# Patient Record
Sex: Male | Born: 1975 | Race: Black or African American | Hispanic: No | Marital: Single | Smoking: Current every day smoker
Health system: Southern US, Community
[De-identification: ages and names within clinical notes are randomized; demographics above are authoritative.]

---

## 2005-12-19 ENCOUNTER — Ambulatory Visit (HOSPITAL_BASED_OUTPATIENT_CLINIC_OR_DEPARTMENT_OTHER): Admission: RE | Admit: 2005-12-19 | Discharge: 2005-12-19 | Payer: Self-pay | Admitting: Orthopedic Surgery

## 2009-07-25 ENCOUNTER — Emergency Department (HOSPITAL_COMMUNITY): Admission: EM | Admit: 2009-07-25 | Discharge: 2009-07-25 | Payer: Self-pay | Admitting: Family Medicine

## 2010-05-30 ENCOUNTER — Emergency Department (HOSPITAL_COMMUNITY): Admission: EM | Admit: 2010-05-30 | Discharge: 2010-05-30 | Payer: Self-pay | Admitting: Family Medicine

## 2010-10-12 ENCOUNTER — Emergency Department (HOSPITAL_COMMUNITY)
Admission: EM | Admit: 2010-10-12 | Discharge: 2010-10-12 | Payer: Self-pay | Source: Home / Self Care | Admitting: Emergency Medicine

## 2010-10-17 LAB — CULTURE, ROUTINE-ABSCESS

## 2010-12-26 ENCOUNTER — Inpatient Hospital Stay (INDEPENDENT_AMBULATORY_CARE_PROVIDER_SITE_OTHER)
Admission: RE | Admit: 2010-12-26 | Discharge: 2010-12-26 | Disposition: A | Discharge status: Home / Self Care | Payer: Self-pay | Source: Ambulatory Visit | Attending: Family Medicine | Admitting: Family Medicine

## 2010-12-26 DIAGNOSIS — M7710 Lateral epicondylitis, unspecified elbow: Secondary | ICD-10-CM

## 2010-12-26 DIAGNOSIS — M67919 Unspecified disorder of synovium and tendon, unspecified shoulder: Secondary | ICD-10-CM

## 2010-12-26 LAB — POCT I-STAT, CHEM 8
BUN: 9 mg/dL (ref 6–23)
Chloride: 106 mEq/L (ref 96–112)
Creatinine, Ser: 0.9 mg/dL (ref 0.4–1.5)
Glucose, Bld: 88 mg/dL (ref 70–99)
Hemoglobin: 18.4 g/dL — ABNORMAL HIGH (ref 13.0–17.0)
Potassium: 4.1 mEq/L (ref 3.5–5.1)
Sodium: 138 mEq/L (ref 135–145)

## 2011-01-21 ENCOUNTER — Ambulatory Visit (HOSPITAL_COMMUNITY)
Admission: RE | Admit: 2011-01-21 | Discharge: 2011-01-21 | Disposition: A | Discharge status: Home / Self Care | Payer: Self-pay | Source: Ambulatory Visit | Attending: Family Medicine | Admitting: Family Medicine

## 2011-01-21 ENCOUNTER — Other Ambulatory Visit (HOSPITAL_COMMUNITY): Payer: Self-pay | Admitting: Family Medicine

## 2011-01-21 DIAGNOSIS — R52 Pain, unspecified: Secondary | ICD-10-CM

## 2011-01-21 DIAGNOSIS — M25819 Other specified joint disorders, unspecified shoulder: Secondary | ICD-10-CM | POA: Insufficient documentation

## 2011-01-21 DIAGNOSIS — M25519 Pain in unspecified shoulder: Secondary | ICD-10-CM | POA: Insufficient documentation

## 2011-02-08 NOTE — Op Note (Signed)
NAMEALIK, Blake Flores              ACCOUNT NO.:  [id number]   MEDICAL RECORD NO.:  [id number]          PATIENT TYPE:  AMB   LOCATION:  DSC                          FACILITY:  [facility]   PHYSICIAN:  Feliberto Gottron. Turner Daniels, M.D.   DATE OF BIRTH:  08-09-1976   DATE OF PROCEDURE:  12/19/2005  DATE OF DISCHARGE:  12/19/2005                                 OPERATIVE REPORT   PREOPERATIVE DIAGNOSIS:  Right distal radius fracture, closed, with 30  degrees of dorsal angulation and 1 cm of shortening.   POSTOPERATIVE DIAGNOSIS:  Right distal radius fracture, closed, with 30  degrees of dorsal angulation and 1 cm of shortening.   PROCEDURE:  Right distal radius fracture, open reduction/internal fixation  using a standard [employer] hand innovation DVR plate with all holes filled  proximally and distally.   SURGEON:  Feliberto Gottron. Turner Daniels, M.D.   FIRST ASSISTANT:  None.   ANESTHESIA:  General endotracheal.   ESTIMATED BLOOD LOSS:  Minimal.   FLUIDS REPLACED:  Crystalloid 500 cc.   TOURNIQUET TIME:  Forty minutes.   INDICATIONS FOR PROCEDURE:  A 35 year old right-hand dominant [place] who sustained a closed dorsally comminuted, 30 degree  apex volar angulated and 1 cm shortened right distal radius fracture trying  to elude the police just prior to his incarceration for grand theft auto.  In any event, he was brought to the [facility] orthopedic center clinic, I  believe the next day, where radiographs were reviewed, and he was felt to be  a candidate for open reduction/internal fixation with a DVR plate to prevent  deformity, increase function, and decrease pain.  Risks and benefits of  surgery were discussed with the patient, and he was prepared for surgical  intervention.   DESCRIPTION OF PROCEDURE:  The patient was identified by arm band, taken to  the operating room at [facility] day surgery center.  The appropriate anesthetic  monitors were attached.  General endotracheal anesthesia  induced with the  patient in the supine position.  The tourniquet was applied to the right  forearm.  The right upper extremity prepped and draped in the usual sterile  fashion from the fingertips to the forearm, and we began the procedure by  making a volar longitudinal incision starting out just distal to the wrist  flexion crease and then following the FCR tendon proximally for a distance  of about 8 cm through the skin and subcutaneous tissue.  Small bleeders were  identified and cauterized.  The sheath over the SCR tendon was then opened  volarly.  The FCR retracted radially and we then cut through the dorsal  sheath of the FCR going right down on top of the pronator quadratus, which  was actually partially torn from the fracture.  The pronator quadratus  insertion was lifted off of the radius with a small key elevator, exposing  the fracture site.  Traction was applied, and the fracture was reduced,  getting the length and angulation back to near anatomic.  The [facility] C-arm  imaging device was used to accomplish this goal.  Satisfied with  the  reduction, we then provisionally placed a [employer] right  standard DVR plate, fixing it to the radius with the glide hole volarly and  then positioning the plate under C-arm control to provide maximum fixation  from the distal locking screws.  A combination of smooth pins and distal  locking screws were then used to fix the fracture in a near-anatomic  position, as documented by the [facility] C-arm imaging device.  The other two  proximal screws were then filled with 3.5 bicortical screws, and final  images were taken, confirming good position of the DVR plate, the distal  locking screws, and the proximal bicortical screws.  At this point, the  tourniquet was let down.  Small bleeders were identified and cauterized.  We  took the wrist through a full range of motion to make sure that all fixation  was firm and stable.  We attempted to  repair the pronator quadratus back to  the radius, but it was simply too shredded, and it was simply laying on top  of the bone.  The subcutaneous tissue was then closed with a 3-0 Vicryl  suture, and the skin with a running, interlocking 4-0 nylon suture.  A  dressing of Xeroform, 4x4 dressing sponges, Webril and Ace wrap and a  Gelfoam splint was then applied.  The patient was then awakened and taken to  the recovery room without difficulty.      Feliberto Gottron. Turner Daniels, M.D.  Electronically Signed     FJR/MEDQ  D:  01/15/2006  T:  01/16/2006  Job:  [id number]

## 2017-10-19 ENCOUNTER — Encounter (HOSPITAL_COMMUNITY): Payer: Self-pay

## 2017-10-19 ENCOUNTER — Other Ambulatory Visit: Payer: Self-pay

## 2017-10-19 ENCOUNTER — Emergency Department (HOSPITAL_COMMUNITY)
Admission: EM | Admit: 2017-10-19 | Discharge: 2017-10-19 | Disposition: A | Discharge status: Home / Self Care | Payer: Self-pay | Attending: Emergency Medicine | Admitting: Emergency Medicine

## 2017-10-19 DIAGNOSIS — W57XXXA Bitten or stung by nonvenomous insect and other nonvenomous arthropods, initial encounter: Secondary | ICD-10-CM | POA: Insufficient documentation

## 2017-10-19 DIAGNOSIS — Y999 Unspecified external cause status: Secondary | ICD-10-CM | POA: Insufficient documentation

## 2017-10-19 DIAGNOSIS — Y939 Activity, unspecified: Secondary | ICD-10-CM | POA: Insufficient documentation

## 2017-10-19 DIAGNOSIS — S40861A Insect bite (nonvenomous) of right upper arm, initial encounter: Secondary | ICD-10-CM | POA: Insufficient documentation

## 2017-10-19 DIAGNOSIS — S40862A Insect bite (nonvenomous) of left upper arm, initial encounter: Secondary | ICD-10-CM | POA: Insufficient documentation

## 2017-10-19 DIAGNOSIS — Y929 Unspecified place or not applicable: Secondary | ICD-10-CM | POA: Insufficient documentation

## 2017-10-19 MED ORDER — HYDROXYZINE HCL 25 MG PO TABS: 25.0000 mg | ORAL_TABLET | Freq: Four times a day (QID) | ORAL | 0 refills | Status: DC

## 2017-10-19 NOTE — ED Notes (Signed)
Bed: WLPT3 Expected date:  Expected time:  Means of arrival:  Comments: 

## 2017-10-19 NOTE — ED Triage Notes (Signed)
He has numerous red, weeping lesions, mainly on right arm, which he tells me is from "bedbugs-I've had them before". He is otherwise healthy-looking.

## 2017-10-19 NOTE — ED Provider Notes (Signed)
  [facility]-EMERGENCY DEPT Provider Note   CSN: [id number] Arrival date & time: 10/19/17  0818     History   Chief Complaint Chief Complaint  Patient presents with  . Insect Bite    HPI Blake Flores is a 42 y.o. male.  42 year old male presents with rash to his body secondary to bug bites.  Patient has no bedbugs and has had some pruritus associated with this as well.  Denies any fever or chills.  Has been using calamine lotion as well as alcohol.  Has recently sprayed his home for the insects.  Nothing makes his symptoms better.      History reviewed. No pertinent past medical history.  There are no active problems to display for this patient.       Home Medications    Prior to Admission medications   Not on File    Family History No family history on file.  Social History Social History   Tobacco Use  . Smoking status: Never Smoker  . Smokeless tobacco: Never Used  Substance Use Topics  . Alcohol use: Not on file  . Drug use: Not on file     Allergies   Patient has no allergy information on record.   Review of Systems Review of Systems  All other systems reviewed and are negative.    Physical Exam Updated Vital Signs BP 121/78 (BP Location: Right Arm)   Pulse 90   Temp 97.8 F (36.6 C) (Oral)   Resp 16   SpO2 97%   Physical Exam  Constitutional: He is oriented to person, place, and time. He appears well-developed and well-nourished.  Non-toxic appearance.  HENT:  Head: Normocephalic and atraumatic.  Eyes: Conjunctivae are normal. Pupils are equal, round, and reactive to light.  Neck: Normal range of motion.  Cardiovascular: Normal rate.  Pulmonary/Chest: Effort normal.  Neurological: He is alert and oriented to person, place, and time.  Skin: Skin is warm and dry.  Multiple areas of small circular lesions noted to upper extremities without surrounding infections.  Psychiatric: He has a normal mood and affect.   Nursing note and vitals reviewed.    ED Treatments / Results  Labs (all labs ordered are listed, but only abnormal results are displayed) Labs Reviewed - No data to display  EKG  EKG Interpretation None       Radiology No results found.  Procedures Procedures (including critical care time)  Medications Ordered in ED Medications - No data to display   Initial Impression / Assessment and Plan / ED Course  I have reviewed the triage vital signs and the nursing notes.  Pertinent labs & imaging results that were available during my care of the patient were reviewed by me and considered in my medical decision making (see chart for details).     Patient has no signs of secondary infection.  Will prescribe Atarax.  Final Clinical Impressions(s) / ED Diagnoses   Final diagnoses:  None    ED Discharge Orders    None       Lorre NickAllen, Darriel Sinquefield, MD 10/19/17 [phone]

## 2017-10-27 ENCOUNTER — Inpatient Hospital Stay (HOSPITAL_COMMUNITY)
Admission: EM | Admit: 2017-10-27 | Discharge: 2017-11-01 | DRG: 603 | Disposition: A | Discharge status: Home / Self Care | Payer: Self-pay | Attending: Internal Medicine | Admitting: Internal Medicine

## 2017-10-27 ENCOUNTER — Encounter (HOSPITAL_COMMUNITY): Payer: Self-pay | Admitting: *Deleted

## 2017-10-27 ENCOUNTER — Other Ambulatory Visit: Payer: Self-pay

## 2017-10-27 DIAGNOSIS — S40862A Insect bite (nonvenomous) of left upper arm, initial encounter: Secondary | ICD-10-CM | POA: Diagnosis present

## 2017-10-27 DIAGNOSIS — R001 Bradycardia, unspecified: Secondary | ICD-10-CM | POA: Diagnosis present

## 2017-10-27 DIAGNOSIS — L03114 Cellulitis of left upper limb: Principal | ICD-10-CM | POA: Diagnosis present

## 2017-10-27 DIAGNOSIS — Z23 Encounter for immunization: Secondary | ICD-10-CM

## 2017-10-27 DIAGNOSIS — L299 Pruritus, unspecified: Secondary | ICD-10-CM | POA: Diagnosis present

## 2017-10-27 DIAGNOSIS — L739 Follicular disorder, unspecified: Secondary | ICD-10-CM

## 2017-10-27 DIAGNOSIS — L0390 Cellulitis, unspecified: Secondary | ICD-10-CM

## 2017-10-27 LAB — CBC WITH DIFFERENTIAL/PLATELET
BASOS ABS: 0 10*3/uL (ref 0.0–0.1)
BASOS PCT: 0 %
EOS ABS: 0.5 10*3/uL (ref 0.0–0.7)
Eosinophils Relative: 4 %
HEMATOCRIT: 43.8 % (ref 39.0–52.0)
HEMOGLOBIN: 15.2 g/dL (ref 13.0–17.0)
LYMPHS PCT: 46 %
Lymphs Abs: 5.2 10*3/uL — ABNORMAL HIGH (ref 0.7–4.0)
MCH: 28.4 pg (ref 26.0–34.0)
MCHC: 34.7 g/dL (ref 30.0–36.0)
MCV: 81.9 fL (ref 78.0–100.0)
MONOS PCT: 10 %
Monocytes Absolute: 1.1 10*3/uL — ABNORMAL HIGH (ref 0.1–1.0)
NEUTROS ABS: 4.5 10*3/uL (ref 1.7–7.7)
NEUTROS PCT: 40 %
Platelets: 315 10*3/uL (ref 150–400)
RBC: 5.35 MIL/uL (ref 4.22–5.81)
RDW: 15.9 % — ABNORMAL HIGH (ref 11.5–15.5)
WBC: 11.3 10*3/uL — ABNORMAL HIGH (ref 4.0–10.5)

## 2017-10-27 LAB — CBG MONITORING, ED: GLUCOSE-CAPILLARY: 106 mg/dL — AB (ref 65–99)

## 2017-10-27 LAB — BASIC METABOLIC PANEL
ANION GAP: 9 (ref 5–15)
BUN: 14 mg/dL (ref 6–20)
CALCIUM: 8.7 mg/dL — AB (ref 8.9–10.3)
CO2: 26 mmol/L (ref 22–32)
Chloride: 104 mmol/L (ref 101–111)
Creatinine, Ser: 0.88 mg/dL (ref 0.61–1.24)
GLUCOSE: 77 mg/dL (ref 65–99)
POTASSIUM: 4.2 mmol/L (ref 3.5–5.1)
Sodium: 139 mmol/L (ref 135–145)

## 2017-10-27 MED ORDER — CLINDAMYCIN PHOSPHATE 300 MG/50ML IV SOLN
300.0000 mg | Freq: Three times a day (TID) | INTRAVENOUS | Status: DC
  Administered 2017-10-28: 300 mg via INTRAVENOUS
  Filled 2017-10-27 (×2): qty 50

## 2017-10-27 MED ORDER — ONDANSETRON HCL 4 MG/2ML IJ SOLN: 4.0000 mg | Freq: Four times a day (QID) | INTRAMUSCULAR | Status: DC | PRN

## 2017-10-27 MED ORDER — ACETAMINOPHEN 325 MG PO TABS: 650.0000 mg | ORAL_TABLET | Freq: Four times a day (QID) | ORAL | Status: DC | PRN

## 2017-10-27 MED ORDER — HYDROXYZINE HCL 25 MG PO TABS
25.0000 mg | ORAL_TABLET | Freq: Three times a day (TID) | ORAL | Status: DC | PRN
  Administered 2017-10-28 – 2017-10-30 (×4): 25 mg via ORAL
  Filled 2017-10-27 (×4): qty 1

## 2017-10-27 MED ORDER — CLINDAMYCIN PHOSPHATE 300 MG/50ML IV SOLN
300.0000 mg | Freq: Once | INTRAVENOUS | Status: AC
Start: 2017-10-27 — End: 2017-10-27
  Administered 2017-10-27: 300 mg via INTRAVENOUS
  Filled 2017-10-27 (×2): qty 50

## 2017-10-27 MED ORDER — PERMETHRIN 5 % EX CREA
TOPICAL_CREAM | Freq: Once | CUTANEOUS | Status: AC
  Administered 2017-10-27: via TOPICAL
  Filled 2017-10-27: qty 60

## 2017-10-27 MED ORDER — SODIUM CHLORIDE 0.9 % IV SOLN
INTRAVENOUS | Status: DC
  Administered 2017-10-27: 23:00:00 via INTRAVENOUS

## 2017-10-27 MED ORDER — SENNOSIDES-DOCUSATE SODIUM 8.6-50 MG PO TABS: 1.0000 | ORAL_TABLET | Freq: Every evening | ORAL | Status: DC | PRN

## 2017-10-27 MED ORDER — ACETAMINOPHEN 650 MG RE SUPP: 650.0000 mg | Freq: Four times a day (QID) | RECTAL | Status: DC | PRN

## 2017-10-27 MED ORDER — ONDANSETRON HCL 4 MG PO TABS: 4.0000 mg | ORAL_TABLET | Freq: Four times a day (QID) | ORAL | Status: DC | PRN

## 2017-10-27 MED ORDER — INFLUENZA VAC SPLIT QUAD 0.5 ML IM SUSY
0.5000 mL | PREFILLED_SYRINGE | INTRAMUSCULAR | Status: AC
  Administered 2017-10-28: 0.5 mL via INTRAMUSCULAR
  Filled 2017-10-27: qty 0.5

## 2017-10-27 MED ORDER — INSULIN ASPART 100 UNIT/ML ~~LOC~~ SOLN: 0.0000 [IU] | Freq: Every day | SUBCUTANEOUS | Status: DC

## 2017-10-27 MED ORDER — INSULIN ASPART 100 UNIT/ML ~~LOC~~ SOLN
0.0000 [IU] | Freq: Three times a day (TID) | SUBCUTANEOUS | Status: DC
  Administered 2017-10-28 – 2017-11-01 (×2): 1 [IU] via SUBCUTANEOUS

## 2017-10-27 NOTE — ED Provider Notes (Signed)
[facility] EMERGENCY DEPARTMENT Provider Note   CSN: [id number] Arrival date & time: 10/27/17  4098     History   Chief Complaint Chief Complaint  Patient presents with  . Insect Bite  . Cellulitis    HPI Blake Flores is a 42 y.o. male.   Rash   This is a new problem. The current episode started more than 1 week ago. The problem has been rapidly worsening. The problem is associated with an insect bite/sting (known bedbugs, treated home). There has been no fever. The rash is present on the left arm. The pain is moderate. The pain has been constant since onset. Associated symptoms include itching, pain and weeping. Treatments tried: lotion, alcohol spray, hydroxyzine. The treatment provided no relief.    History reviewed. No pertinent past medical history.  There are no active problems to display for this patient.   History reviewed. No pertinent surgical history.     Home Medications    Prior to Admission medications   Medication Sig Start Date End Date Taking? Authorizing Provider  hydrOXYzine (ATARAX/VISTARIL) 25 MG tablet Take 1 tablet (25 mg total) by mouth every 6 (six) hours. 10/19/17   Lorre Nick, MD    Family History History reviewed. No pertinent family history.  Social History Social History   Tobacco Use  . Smoking status: Never Smoker  . Smokeless tobacco: Never Used  Substance Use Topics  . Alcohol use: Not on file  . Drug use: Not on file     Allergies   Patient has no known allergies.   Review of Systems Review of Systems  Constitutional: Negative for chills and fever.  HENT: Negative for ear pain and sore throat.   Eyes: Negative for pain and visual disturbance.  Respiratory: Negative for cough and shortness of breath.   Cardiovascular: Negative for chest pain and palpitations.  Gastrointestinal: Negative for abdominal pain and vomiting.  Genitourinary: Negative for dysuria and hematuria.  Musculoskeletal:  Negative for arthralgias and back pain.  Skin: Positive for itching and rash. Negative for color change.  Neurological: Negative for seizures and syncope.  All other systems reviewed and are negative.    Physical Exam Updated Vital Signs BP 128/79   Pulse (!) 48   Temp 97.7 F (36.5 C) (Oral)   Resp 18   SpO2 95%   Physical Exam  Constitutional: He is oriented to person, place, and time. He appears well-developed and well-nourished.  HENT:  Head: Normocephalic and atraumatic.  Eyes: Conjunctivae and EOM are normal. Pupils are equal, round, and reactive to light.  Neck: Neck supple.  Cardiovascular: Normal rate and regular rhythm.  Pulmonary/Chest: Effort normal and breath sounds normal. No respiratory distress.  Abdominal: Soft. There is no tenderness.  Musculoskeletal: He exhibits no edema.  Neurological: He is alert and oriented to person, place, and time.  Skin: Skin is warm and dry. Rash (foliculitis with desquamation) noted.  Psychiatric: He has a normal mood and affect.  Nursing note and vitals reviewed.    ED Treatments / Results  Labs (all labs ordered are listed, but only abnormal results are displayed) Labs Reviewed  BASIC METABOLIC PANEL - Abnormal; Notable for the following components:      Result Value   Calcium 8.7 (*)    All other components within normal limits  CBC WITH DIFFERENTIAL/PLATELET - Abnormal; Notable for the following components:   WBC 11.3 (*)    RDW 15.9 (*)    Lymphs Abs 5.2 (*)  Monocytes Absolute 1.1 (*)    All other components within normal limits    EKG  EKG Interpretation None       Radiology No results found.  Procedures Procedures (including critical care time)  Medications Ordered in ED Medications  clindamycin (CLEOCIN) IVPB 300 mg (not administered)     Initial Impression / Assessment and Plan / ED Course  I have reviewed the triage vital signs and the nursing notes.  Pertinent labs & imaging results  that were available during my care of the patient were reviewed by me and considered in my medical decision making (see chart for details).     Blake Flores is a 42 year old male with noCharmayne Sheer significant past medical history who presents for evaluation of left arm rash.  Patient was seen in the emergency department 1 week ago and was diagnosed with bedbug bites.  He was provided antipruritic medications, no antibiotics.  The patient states that he has since developed a painful, burning, pustular rash with areas of desquamation.  Labs obtained are significant for leukocytosis.  Patient is provided clindamycin in the emergency department.  Patient is admitted for observation to the hospitalist.  Final Clinical Impressions(s) / ED Diagnoses   Final diagnoses:  Folliculitis  Cellulitis, unspecified cellulitis site    ED Discharge Orders    None       Garey Hamean, Sunil Hue E, MD 10/28/17 [id number]    Gerhard MunchLockwood, Robert, MD 10/29/17 [phone]

## 2017-10-27 NOTE — H&P (Signed)
History and Physical    Blake Flores XLK:[id number] DOB: 01/07/1976 DOA: 10/27/2017  PCP: Patient, No Pcp Per Patient coming from: Home  Chief Complaint: Itching and left arm pain  HPI: Blake Flores is a 42 y.o. male without any significant past medical history came to the hospital with complains of left arm pain and diffuse itching.  Patient states these complaints started about 2 weeks ago and had presented to the ER about a week ago.  At that time there were concerns of bedbugs at home and was discharged from the ER with Atarax.  After going home patient continued to have this and started noticing of left upper extremity warmth, swelling and multiple "pimples".  In the meantime he admits that he did have get his home treated for bedbugs.  In the ER patient was noted to have quite significant swelling, erythema and warmth of the left upper extremity along with diffuse folliculitis.  She was started on IV clindamycin and admitted for observation.  Denies any PMHX and no home medications.  Patient doesn't know any family history.    Review of Systems: As per HPI otherwise 10 point review of systems negative.   History reviewed. No pertinent past medical history.  History reviewed. No pertinent surgical history.   reports that  has never smoked. he has never used smokeless tobacco. His alcohol and drug histories are not on file.  No Known Allergies  History reviewed. No pertinent family history.   Prior to Admission medications   Not on File    Physical Exam: Vitals:   10/27/17 2045 10/27/17 2100 10/27/17 2115 10/27/17 2130  BP: 128/71 128/75  118/77  Pulse: (!) 58  74 (!) 58  Resp: 16   16  Temp:      TempSrc:      SpO2: 97%  97% 97%      Constitutional: NAD, calm, comfortable Vitals:   10/27/17 2045 10/27/17 2100 10/27/17 2115 10/27/17 2130  BP: 128/71 128/75  118/77  Pulse: (!) 58  74 (!) 58  Resp: 16   16  Temp:      TempSrc:      SpO2: 97%  97% 97%    Eyes: PERRL, lids and conjunctivae normal ENMT: Mucous membranes are moist. Posterior pharynx clear of any exudate or lesions.Normal dentition.  Neck: normal, supple, no masses, no thyromegaly Respiratory: clear to auscultation bilaterally, no wheezing, no crackles. Normal respiratory effort. No accessory muscle use.  Cardiovascular: Regular rate and rhythm, no murmurs / rubs / gallops. No extremity edema. 2+ pedal pulses. No carotid bruits.  Abdomen: no tenderness, no masses palpated. No hepatosplenomegaly. Bowel sounds positive.  Musculoskeletal: no clubbing / cyanosis. No joint deformity upper and lower extremities. Good ROM, no contractures. Normal muscle tone.  Skin: Left upper extremity warmth and slight swelling noted with diffuse folliculitis.  Multiple skin abrasion noted throughout from constant itching. Neurologic: CN 2-12 grossly intact. Sensation intact, DTR normal. Strength 5/5 in all 4.  Psychiatric: Normal judgment and insight. Alert and oriented x 3. Normal mood.     Labs on Admission: I have personally reviewed following labs and imaging studies  CBC: Recent Labs  Lab 10/27/17 1330  WBC 11.3*  NEUTROABS 4.5  HGB 15.2  HCT 43.8  MCV 81.9  PLT 315   Basic Metabolic Panel: Recent Labs  Lab 10/27/17 1330  NA 139  K 4.2  CL 104  CO2 26  GLUCOSE 77  BUN 14  CREATININE 0.88  CALCIUM 8.7*  GFR: CrCl cannot be calculated (Unknown ideal weight.). Liver Function Tests: No results for input(s): AST, ALT, ALKPHOS, BILITOT, PROT, ALBUMIN in the last 168 hours. No results for input(s): LIPASE, AMYLASE in the last 168 hours. No results for input(s): AMMONIA in the last 168 hours. Coagulation Profile: No results for input(s): INR, PROTIME in the last 168 hours. Cardiac Enzymes: No results for input(s): CKTOTAL, CKMB, CKMBINDEX, TROPONINI in the last 168 hours. BNP (last 3 results) No results for input(s): PROBNP in the last 8760 hours. HbA1C: No results for  input(s): HGBA1C in the last 72 hours. CBG: No results for input(s): GLUCAP in the last 168 hours. Lipid Profile: No results for input(s): CHOL, HDL, LDLCALC, TRIG, CHOLHDL, LDLDIRECT in the last 72 hours. Thyroid Function Tests: No results for input(s): TSH, T4TOTAL, FREET4, T3FREE, THYROIDAB in the last 72 hours. Anemia Panel: No results for input(s): VITAMINB12, FOLATE, FERRITIN, TIBC, IRON, RETICCTPCT in the last 72 hours. Urine analysis: No results found for: COLORURINE, APPEARANCEUR, LABSPEC, PHURINE, GLUCOSEU, HGBUR, BILIRUBINUR, KETONESUR, PROTEINUR, UROBILINOGEN, NITRITE, LEUKOCYTESUR Sepsis Labs: !!!!!!!!!!!!!!!!!!!!!!!!!!!!!!!!!!!!!!!!!!!! @LABRCNTIP (procalcitonin:4,lacticidven:4) )No results found for this or any previous visit (from the past 240 hour(s)).   Radiological Exams on Admission: No results found.  EKG: Independently reviewed.   Assessment/Plan Active Problems:   Cellulitis    Cellulitis of the left upper extremity with concerning folliculitis Multiple bedbugs bite/skin irritation - Admit for observation given the extent of the cellulitis -We will give IV clindamycin, IV fluids -Pain control, order permethrin cream 5% -Pain control, Atarax for itching -Provide supportive care    DVT prophylaxis: Early ambulation Code Status: Full code Family Communication: Patient communicates well Disposition Plan: To be determined Consults called: None Admission status: Observation   Ankit Joline Maxcy MD [facility] Pager [phone]  If 7PM-7AM, please contact night-coverage [email or web address] Password [email or web address]  10/27/2017, 10:13 PM

## 2017-10-27 NOTE — ED Notes (Signed)
Ordered pt a dinner tray 

## 2017-10-27 NOTE — ED Triage Notes (Signed)
[facility] and diagnosed with bed bugs. Now has redness, pain, swelling and drainage to entire left arm.

## 2017-10-27 NOTE — ED Notes (Signed)
Dinner tray at bedside

## 2017-10-27 NOTE — ED Notes (Signed)
ED Provider at bedside. 

## 2017-10-28 ENCOUNTER — Observation Stay (HOSPITAL_BASED_OUTPATIENT_CLINIC_OR_DEPARTMENT_OTHER): Payer: Self-pay

## 2017-10-28 DIAGNOSIS — M79609 Pain in unspecified limb: Secondary | ICD-10-CM

## 2017-10-28 DIAGNOSIS — M7989 Other specified soft tissue disorders: Secondary | ICD-10-CM

## 2017-10-28 LAB — GLUCOSE, CAPILLARY
GLUCOSE-CAPILLARY: 88 mg/dL (ref 65–99)
Glucose-Capillary: 119 mg/dL — ABNORMAL HIGH (ref 65–99)
Glucose-Capillary: 134 mg/dL — ABNORMAL HIGH (ref 65–99)
Glucose-Capillary: 100 mg/dL — ABNORMAL HIGH (ref 65–99)

## 2017-10-28 LAB — COMPREHENSIVE METABOLIC PANEL
ALK PHOS: 72 U/L (ref 38–126)
ALT: 18 U/L (ref 17–63)
ANION GAP: 11 (ref 5–15)
AST: 17 U/L (ref 15–41)
Albumin: 3 g/dL — ABNORMAL LOW (ref 3.5–5.0)
BILIRUBIN TOTAL: 0.7 mg/dL (ref 0.3–1.2)
BUN: 14 mg/dL (ref 6–20)
CALCIUM: 8.3 mg/dL — AB (ref 8.9–10.3)
CO2: 23 mmol/L (ref 22–32)
CREATININE: 0.89 mg/dL (ref 0.61–1.24)
Chloride: 106 mmol/L (ref 101–111)
Glucose, Bld: 98 mg/dL (ref 65–99)
Potassium: 3.8 mmol/L (ref 3.5–5.1)
SODIUM: 140 mmol/L (ref 135–145)
TOTAL PROTEIN: 5.5 g/dL — AB (ref 6.5–8.1)

## 2017-10-28 LAB — HIV ANTIBODY (ROUTINE TESTING W REFLEX): HIV SCREEN 4TH GENERATION: NONREACTIVE

## 2017-10-28 MED ORDER — CEFTRIAXONE SODIUM 2 G IJ SOLR
2.0000 g | INTRAMUSCULAR | Status: DC
Start: 2017-10-28 — End: 2017-10-31
  Administered 2017-10-28 – 2017-10-30 (×3): 2 g via INTRAVENOUS
  Filled 2017-10-28 (×5): qty 2

## 2017-10-28 MED ORDER — TRAMADOL HCL 50 MG PO TABS: 50.0000 mg | ORAL_TABLET | Freq: Four times a day (QID) | ORAL | Status: DC | PRN

## 2017-10-28 MED ORDER — OXYCODONE HCL 5 MG PO TABS
5.0000 mg | ORAL_TABLET | Freq: Four times a day (QID) | ORAL | Status: DC | PRN
  Administered 2017-10-28 – 2017-10-30 (×6): 5 mg via ORAL
  Filled 2017-10-28 (×6): qty 1

## 2017-10-28 MED ORDER — VANCOMYCIN HCL 10 G IV SOLR
1500.0000 mg | Freq: Two times a day (BID) | INTRAVENOUS | Status: DC
  Administered 2017-10-28 – 2017-10-31 (×6): 1500 mg via INTRAVENOUS
  Filled 2017-10-28 (×7): qty 1500

## 2017-10-28 MED ORDER — TRIAMCINOLONE ACETONIDE 0.1 % EX CREA
TOPICAL_CREAM | Freq: Two times a day (BID) | CUTANEOUS | Status: DC
  Administered 2017-10-28 – 2017-11-01 (×9): via TOPICAL
  Filled 2017-10-28 (×2): qty 15

## 2017-10-28 NOTE — Progress Notes (Signed)
Pharmacy Antibiotic Note  Blake Flores is a 42 y.o. male admitted on 10/27/2017 with cellulitis.  Pharmacy has been consulted for vancomycin dosing.  Patient with inset bites- house has been treated for bed bugs. Originally placed on clindamycin IV, now to transition to ceftriaxone and vancomycin.  Plan: Vnacomycin 1500mg  IV q12h Ceftriaxone 2g IV q24h per MD Follow clinical progression, de-escalation/LOT plans Follow renal function, vancomycin trough PRN (goal 10-7115mcg/mL)  Height: 5\' 10"  (177.8 cm) Weight: 158 lb 8.2 oz (71.9 kg) IBW/kg (Calculated) : 73  Temp (24hrs), Avg:97.9 F (36.6 C), Min:97.7 F (36.5 C), Max:98.2 F (36.8 C)  Recent Labs  Lab 10/27/17 1330 10/28/17 0356  WBC 11.3*  --   CREATININE 0.88 0.89    Estimated Creatinine Clearance: 108.8 mL/min (by C-G formula based on SCr of 0.89 mg/dL).    No Known Allergies  Antimicrobials this admission: Clinda 2/4>>2/5 Ceftriaxone 2/5>> Vanc 2/5>>  Dose adjustments this admission: n/a  Microbiology results: none  Thank you for allowing pharmacy to be a part of this patient's care.  Zayan Delvecchio D. Tammara Massing, PharmD, BCPS Clinical Pharmacist Clinical Phone for 10/28/2017 until 3:30pm: [phone] If after 3:30pm, please call main pharmacy at [phone] 10/28/2017 11:56 AM

## 2017-10-28 NOTE — Progress Notes (Signed)
Visited with patient and shared Advanced Directive forms with him. Shared to call for chaplain when ready to fill out the forms and get signatures. Phebe CollaDonna S Augusten Lipkin, Chaplain   10/28/17 1000  Clinical Encounter Type  Visited With Patient  Visit Type Initial;Other (Comment) (Gave AD forms)  Referral From Physician  Consult/Referral To Chaplain

## 2017-10-28 NOTE — Progress Notes (Signed)
Patient ID: Blake Flores, male   DOB: 07-Jul-1976, 42 y.o.   MRN: [id number]  PROGRESS NOTE    Blake Flores  WUJ:[id number] DOB: Aug 20, 1976 DOA: 10/27/2017 PCP: Patient, No Pcp Per   Brief Narrative:  42 year old male with no past medical history presented with worsening left arm pain and diffuse itching for the last 2 weeks.  He was admitted with left upper extremity cellulitis with concerns for multiple bedbugs bite and started on IV clindamycin.   Assessment & Plan:   Active Problems:   Cellulitis    Cellulitis of the left upper extremity with concerning folliculitis most likely due to multiple bedbugs bite/skin irritation -Currently on clindamycin.  Change antibiotics to vancomycin and Rocephin  -Continue pain management and antihistamines -Left upper extremity duplex to rule out DVT -If skin lesions do not improve with antibiotics, patient might need dermatology evaluation - apply triamcinolone cream - check HIV   DVT prophylaxis: Early ambulation Code Status: Full Family Communication: None at bedside Disposition Plan: Home in 1-2 days  Consultants: None  Procedures: None  Antimicrobials: Clindamycin from 10/27/2017 onwards   Subjective: Patient seen and examined at bedside.  He does not think that his left upper extremity is getting better.  No overnight fever or vomiting.  Objective: Vitals:   10/27/17 2336 10/27/17 2339 10/28/17 0500 10/28/17 0657  BP: 130/75   112/65  Pulse: 62   61  Resp:  18    Temp: 97.9 F (36.6 C)   97.9 F (36.6 C)  TempSrc: Oral Oral  Oral  SpO2: 99%   96%  Weight:  71.9 kg (158 lb 8.2 oz) 71.9 kg (158 lb 8.2 oz)   Height:  5\' 10"  (1.778 m)      Intake/Output Summary (Last 24 hours) at 10/28/2017 1129 Last data filed at 10/28/2017 0300 Gross per 24 hour  Intake 539.58 ml  Output -  Net 539.58 ml   Filed Weights   10/27/17 2339 10/28/17 0500  Weight: 71.9 kg (158 lb 8.2 oz) 71.9 kg (158 lb 8.2 oz)    Examination:  General  exam: Appears calm and comfortable  Respiratory system: Bilateral decreased breath sound at bases Cardiovascular system: S1 & S2 heard, rate controlled  gastrointestinal system: Abdomen is nondistended, soft and nontender. Normal bowel sounds heard. Skin: Left upper extremity warmth and slight swelling noted with diffuse folliculitis.  Multiple skin abrasion noted   Data Reviewed: I have personally reviewed following labs and imaging studies  CBC: Recent Labs  Lab 10/27/17 1330  WBC 11.3*  NEUTROABS 4.5  HGB 15.2  HCT 43.8  MCV 81.9  PLT 315   Basic Metabolic Panel: Recent Labs  Lab 10/27/17 1330 10/28/17 0356  NA 139 140  K 4.2 3.8  CL 104 106  CO2 26 23  GLUCOSE 77 98  BUN 14 14  CREATININE 0.88 0.89  CALCIUM 8.7* 8.3*   GFR: Estimated Creatinine Clearance: 108.8 mL/min (by C-G formula based on SCr of 0.89 mg/dL). Liver Function Tests: Recent Labs  Lab 10/28/17 0356  AST 17  ALT 18  ALKPHOS 72  BILITOT 0.7  PROT 5.5*  ALBUMIN 3.0*   No results for input(s): LIPASE, AMYLASE in the last 168 hours. No results for input(s): AMMONIA in the last 168 hours. Coagulation Profile: No results for input(s): INR, PROTIME in the last 168 hours. Cardiac Enzymes: No results for input(s): CKTOTAL, CKMB, CKMBINDEX, TROPONINI in the last 168 hours. BNP (last 3 results) No results for input(s): PROBNP in  the last 8760 hours. HbA1C: No results for input(s): HGBA1C in the last 72 hours. CBG: Recent Labs  Lab 10/27/17 2236 10/28/17 0834  GLUCAP 106* 88   Lipid Profile: No results for input(s): CHOL, HDL, LDLCALC, TRIG, CHOLHDL, LDLDIRECT in the last 72 hours. Thyroid Function Tests: No results for input(s): TSH, T4TOTAL, FREET4, T3FREE, THYROIDAB in the last 72 hours. Anemia Panel: No results for input(s): VITAMINB12, FOLATE, FERRITIN, TIBC, IRON, RETICCTPCT in the last 72 hours. Sepsis Labs: No results for input(s): PROCALCITON, LATICACIDVEN in the last 168  hours.  No results found for this or any previous visit (from the past 240 hour(s)).       Radiology Studies: No results found.      Scheduled Meds: . Influenza vac split quadrivalent PF  0.5 mL Intramuscular Tomorrow-1000  . insulin aspart  0-5 Units Subcutaneous QHS  . insulin aspart  0-9 Units Subcutaneous TID WC   Continuous Infusions: . clindamycin (CLEOCIN) IV Stopped (10/28/17 0316)     LOS: 0 days        Glade LloydKshitiz Nicie Milan, MD [facility] Pager [phone]  If 7PM-7AM, please contact night-coverage [email or web address] Password [email or web address] 10/28/2017, 11:29 AM

## 2017-10-28 NOTE — Progress Notes (Signed)
Preliminary results by tech - Venous Duplex Left Upper Ext. Completed. Negative for deep and superficial vein thrombosis. Blake Flores, BS, RDMS, RVT

## 2017-10-29 LAB — CBC WITH DIFFERENTIAL/PLATELET
BASOS ABS: 0 10*3/uL (ref 0.0–0.1)
Basophils Relative: 0 %
EOS ABS: 0.5 10*3/uL (ref 0.0–0.7)
Eosinophils Relative: 5 %
HCT: 41.8 % (ref 39.0–52.0)
Hemoglobin: 14 g/dL (ref 13.0–17.0)
Lymphocytes Relative: 39 %
Lymphs Abs: 4.2 10*3/uL — ABNORMAL HIGH (ref 0.7–4.0)
MCH: 27.6 pg (ref 26.0–34.0)
MCHC: 33.5 g/dL (ref 30.0–36.0)
MCV: 82.3 fL (ref 78.0–100.0)
MONO ABS: 1 10*3/uL (ref 0.1–1.0)
Monocytes Relative: 9 %
NEUTROS ABS: 5 10*3/uL (ref 1.7–7.7)
NEUTROS PCT: 47 %
PLATELETS: 294 10*3/uL (ref 150–400)
RBC: 5.08 MIL/uL (ref 4.22–5.81)
RDW: 16.1 % — AB (ref 11.5–15.5)
WBC: 10.7 10*3/uL — ABNORMAL HIGH (ref 4.0–10.5)

## 2017-10-29 LAB — COMPREHENSIVE METABOLIC PANEL
ALT: 16 U/L — ABNORMAL LOW (ref 17–63)
ANION GAP: 10 (ref 5–15)
AST: 19 U/L (ref 15–41)
Albumin: 3 g/dL — ABNORMAL LOW (ref 3.5–5.0)
Alkaline Phosphatase: 74 U/L (ref 38–126)
BUN: 9 mg/dL (ref 6–20)
CHLORIDE: 106 mmol/L (ref 101–111)
CO2: 23 mmol/L (ref 22–32)
CREATININE: 0.9 mg/dL (ref 0.61–1.24)
Calcium: 8.1 mg/dL — ABNORMAL LOW (ref 8.9–10.3)
Glucose, Bld: 94 mg/dL (ref 65–99)
POTASSIUM: 4.1 mmol/L (ref 3.5–5.1)
SODIUM: 139 mmol/L (ref 135–145)
Total Bilirubin: 0.4 mg/dL (ref 0.3–1.2)
Total Protein: 5.4 g/dL — ABNORMAL LOW (ref 6.5–8.1)

## 2017-10-29 LAB — HIV ANTIBODY (ROUTINE TESTING W REFLEX): HIV SCREEN 4TH GENERATION: NONREACTIVE

## 2017-10-29 LAB — MAGNESIUM: MAGNESIUM: 2 mg/dL (ref 1.7–2.4)

## 2017-10-29 LAB — GLUCOSE, CAPILLARY
GLUCOSE-CAPILLARY: 108 mg/dL — AB (ref 65–99)
GLUCOSE-CAPILLARY: 100 mg/dL — AB (ref 65–99)
Glucose-Capillary: 108 mg/dL — ABNORMAL HIGH (ref 65–99)

## 2017-10-29 LAB — C-REACTIVE PROTEIN: CRP: <0.8 mg/dL (ref <1.0)

## 2017-10-29 NOTE — Progress Notes (Signed)
Patient ID: Blake Flores, male   DOB: Feb 09, 1976, 42 y.o.   MRN: [id number]  PROGRESS NOTE    Blake Flores  WUJ:[id number] DOB: Feb 05, 1976 DOA: 10/27/2017 PCP: Patient, No Pcp Per   Brief Narrative:  42 year old male with no past medical history presented with worsening left arm pain and diffuse itching for the last 2 weeks.  He was admitted with left upper extremity cellulitis with concerns for multiple bedbugs bite and started on IV clindamycin.  Subjective:  10/29/17: patient seen and examined at his bedside. Reports persistent left arm pruritus and left abdominal pruritus. No other complaints.   Assessment & Plan:   Active Problems:   Cellulitis   Cellulitis of the left upper extremity with concerning folliculitis most likely due to multiple bedbugs bite/skin irritation -10/29/17 continue IV vancomycin and IV rocephin clindamycin -Continue pain management and antihistamines -Left upper extremity duplex negative for DVT -IF no improvement today will consult dermatology in the am -continue triamcinolone cream -10/29/17 HIV non reactive  Bradycardia, asymptomatic -HR 50 -continue to monitor on telemetry   DVT prophylaxis: Early ambulation/SCDs Code Status: Full Family Communication: None at bedside Disposition Plan: Home in 1-2 days  Consultants: None  Procedures: None  Antimicrobials: Clindamycin from 10/27/2017 on wards    Objective: Vitals:   10/28/17 0657 10/28/17 1456 10/28/17 2159 10/29/17 0500  BP: 112/65 138/75 137/83   Pulse: 61 (!) 55 (!) 50   Resp:  18 17   Temp: 97.9 F (36.6 C) 98.1 F (36.7 C) 97.6 F (36.4 C)   TempSrc: Oral Oral Oral   SpO2: 96% 100% 100%   Weight:    72.4 kg (159 lb 9.8 oz)  Height:        Intake/Output Summary (Last 24 hours) at 10/29/2017 0814 Last data filed at 10/29/2017 0054 Gross per 24 hour  Intake 1050 ml  Output -  Net 1050 ml   Filed Weights   10/27/17 2339 10/28/17 0500 10/29/17 0500  Weight: 71.9 kg (158 lb 8.2  oz) 71.9 kg (158 lb 8.2 oz) 72.4 kg (159 lb 9.8 oz)    Examination: 10/29/17 patient seen and examined. No change from prior exam.  General exam: Appears calm and comfortable  Respiratory system: Bilateral decreased breath sound at bases Cardiovascular system: S1 & S2 heard, rate controlled  gastrointestinal system: Abdomen is nondistended, soft and nontender. Normal bowel sounds heard. Skin: Left upper extremity warmth and slight swelling noted with diffuse folliculitis.  Multiple skin abrasion noted. Also left sided  Abdominal diffused folliculitis.   Data Reviewed: I have personally reviewed following labs and imaging studies  CBC: Recent Labs  Lab 10/27/17 1330 10/29/17 0415  WBC 11.3* 10.7*  NEUTROABS 4.5 5.0  HGB 15.2 14.0  HCT 43.8 41.8  MCV 81.9 82.3  PLT 315 294   Basic Metabolic Panel: Recent Labs  Lab 10/27/17 1330 10/28/17 0356 10/29/17 0415  NA 139 140 139  K 4.2 3.8 4.1  CL 104 106 106  CO2 26 23 23   GLUCOSE 77 98 94  BUN 14 14 9   CREATININE 0.88 0.89 0.90  CALCIUM 8.7* 8.3* 8.1*  MG  --   --  2.0   GFR: Estimated Creatinine Clearance: 108.4 mL/min (by C-G formula based on SCr of 0.9 mg/dL). Liver Function Tests: Recent Labs  Lab 10/28/17 0356 10/29/17 0415  AST 17 19  ALT 18 16*  ALKPHOS 72 74  BILITOT 0.7 0.4  PROT 5.5* 5.4*  ALBUMIN 3.0* 3.0*   No results  for input(s): LIPASE, AMYLASE in the last 168 hours. No results for input(s): AMMONIA in the last 168 hours. Coagulation Profile: No results for input(s): INR, PROTIME in the last 168 hours. Cardiac Enzymes: No results for input(s): CKTOTAL, CKMB, CKMBINDEX, TROPONINI in the last 168 hours. BNP (last 3 results) No results for input(s): PROBNP in the last 8760 hours. HbA1C: No results for input(s): HGBA1C in the last 72 hours. CBG: Recent Labs  Lab 10/27/17 2236 10/28/17 0834 10/28/17 1228 10/28/17 1732 10/28/17 2158  GLUCAP 106* 88 119* 134* 100*   Lipid Profile: No results  for input(s): CHOL, HDL, LDLCALC, TRIG, CHOLHDL, LDLDIRECT in the last 72 hours. Thyroid Function Tests: No results for input(s): TSH, T4TOTAL, FREET4, T3FREE, THYROIDAB in the last 72 hours. Anemia Panel: No results for input(s): VITAMINB12, FOLATE, FERRITIN, TIBC, IRON, RETICCTPCT in the last 72 hours. Sepsis Labs: No results for input(s): PROCALCITON, LATICACIDVEN in the last 168 hours.  No results found for this or any previous visit (from the past 240 hour(s)).       Scheduled Meds: . insulin aspart  0-5 Units Subcutaneous QHS  . insulin aspart  0-9 Units Subcutaneous TID WC  . triamcinolone cream   Topical BID   Continuous Infusions: . cefTRIAXone (ROCEPHIN)  IV Stopped (10/28/17 1621)  . vancomycin Stopped (10/29/17 0254)     LOS: 1 day        Darlin Droparole N Troy Hartzog, MD [facility] Pager [phone]  If 7PM-7AM, please contact night-coverage [email or web address] Password [email or web address] 10/29/2017, 8:14 AM

## 2017-10-30 DIAGNOSIS — R001 Bradycardia, unspecified: Secondary | ICD-10-CM

## 2017-10-30 LAB — GLUCOSE, CAPILLARY
GLUCOSE-CAPILLARY: 83 mg/dL (ref 65–99)
Glucose-Capillary: 107 mg/dL — ABNORMAL HIGH (ref 65–99)
Glucose-Capillary: 75 mg/dL (ref 65–99)
Glucose-Capillary: 91 mg/dL (ref 65–99)
Glucose-Capillary: 98 mg/dL (ref 65–99)

## 2017-10-30 MED ORDER — ENOXAPARIN SODIUM 40 MG/0.4ML ~~LOC~~ SOLN
40.0000 mg | SUBCUTANEOUS | Status: DC
  Administered 2017-10-30 – 2017-10-31 (×2): 40 mg via SUBCUTANEOUS
  Filled 2017-10-30 (×2): qty 0.4

## 2017-10-30 NOTE — Progress Notes (Signed)
Patient ID: Blake Flores, male   DOB: 09/27/1975, 42 y.o.   MRN: [id number]  PROGRESS NOTE    Blake Flores  WUJ:[id number] DOB: 12-04-1975 DOA: 10/27/2017 PCP: Patient, No Pcp Per   Brief Narrative:  42 year old male with no past medical history presented with worsening left arm pain and diffuse itching for the last 2 weeks.  He was admitted with left upper extremity cellulitis with concerns for multiple bedbugs bite and started on IV clindamycin.  Subjective:  10/30/17: Seen and examined reports itching in left arm and left side of abdomen. No dermatology service. May attempt to obtain skin biopsy for pathology or transfer the patient to institution that has dermatology service.   Assessment & Plan:   Active Problems:   Cellulitis   Cellulitis of the left upper extremity with concerning folliculitis most likely due to multiple bedbugs bite/skin irritation -10/30/17 continue IV vancomycin and IV rocephin clindamycin -Continue pain management and antihistamines -Left upper extremity duplex negative for DVT -IF no improvement today will consult dermatology in the am -continue triamcinolone cream -10/29/17 HIV non reactive  Bradycardia, asymptomatic -10/30/17 persistent with HR 50 -continue to monitor on telemetry   DVT prophylaxis: Early ambulation/SCDs/sq lovenox Code Status: Full Family Communication: None at bedside Disposition Plan: to be determined  Consultants: Dermatology service not available.  Procedures: None  Antimicrobials: Clindamycin from 10/27/2017 on wards    Objective: Vitals:   10/28/17 2159 10/29/17 0500 10/29/17 2337 10/30/17 0644  BP: 137/83  (!) 103/56 99/60  Pulse: (!) 50  (!) 50 (!) 52  Resp: 17  16 12   Temp: 97.6 F (36.4 C)  97.7 F (36.5 C) 98.1 F (36.7 C)  TempSrc: Oral  Oral Oral  SpO2: 100%  100% 97%  Weight:  72.4 kg (159 lb 9.8 oz)  75 kg (165 lb 5.5 oz)  Height:        Intake/Output Summary (Last 24 hours) at 10/30/2017 0836 Last data  filed at 10/30/2017 0331 Gross per 24 hour  Intake 1690 ml  Output 900 ml  Net 790 ml   Filed Weights   10/28/17 0500 10/29/17 0500 10/30/17 0644  Weight: 71.9 kg (158 lb 8.2 oz) 72.4 kg (159 lb 9.8 oz) 75 kg (165 lb 5.5 oz)    Examination: 10/30/17 patient seen and examined. No change from prior exam.  General exam: Appears calm and comfortable  Respiratory system: Bilateral decreased breath sound at bases Cardiovascular system: S1 & S2 heard, rate controlled  gastrointestinal system: Abdomen is nondistended, soft and nontender. Normal bowel sounds heard. Skin: Left upper extremity warmth and slight swelling noted with diffuse folliculitis.  Multiple skin abrasion noted. Also left sided  Abdominal diffused folliculitis.   Data Reviewed: I have personally reviewed following labs and imaging studies  CBC: Recent Labs  Lab 10/27/17 1330 10/29/17 0415  WBC 11.3* 10.7*  NEUTROABS 4.5 5.0  HGB 15.2 14.0  HCT 43.8 41.8  MCV 81.9 82.3  PLT 315 294   Basic Metabolic Panel: Recent Labs  Lab 10/27/17 1330 10/28/17 0356 10/29/17 0415  NA 139 140 139  K 4.2 3.8 4.1  CL 104 106 106  CO2 26 23 23   GLUCOSE 77 98 94  BUN 14 14 9   CREATININE 0.88 0.89 0.90  CALCIUM 8.7* 8.3* 8.1*  MG  --   --  2.0   GFR: Estimated Creatinine Clearance: 109.3 mL/min (by C-G formula based on SCr of 0.9 mg/dL). Liver Function Tests: Recent Labs  Lab 10/28/17 0356 10/29/17  0415  AST 17 19  ALT 18 16*  ALKPHOS 72 74  BILITOT 0.7 0.4  PROT 5.5* 5.4*  ALBUMIN 3.0* 3.0*   No results for input(s): LIPASE, AMYLASE in the last 168 hours. No results for input(s): AMMONIA in the last 168 hours. Coagulation Profile: No results for input(s): INR, PROTIME in the last 168 hours. Cardiac Enzymes: No results for input(s): CKTOTAL, CKMB, CKMBINDEX, TROPONINI in the last 168 hours. BNP (last 3 results) No results for input(s): PROBNP in the last 8760 hours. HbA1C: No results for input(s): HGBA1C in the  last 72 hours. CBG: Recent Labs  Lab 10/29/17 0823 10/29/17 1218 10/29/17 1654 10/29/17 2335 10/30/17 0741  GLUCAP 108* 108* 100* 107* 75   Lipid Profile: No results for input(s): CHOL, HDL, LDLCALC, TRIG, CHOLHDL, LDLDIRECT in the last 72 hours. Thyroid Function Tests: No results for input(s): TSH, T4TOTAL, FREET4, T3FREE, THYROIDAB in the last 72 hours. Anemia Panel: No results for input(s): VITAMINB12, FOLATE, FERRITIN, TIBC, IRON, RETICCTPCT in the last 72 hours. Sepsis Labs: No results for input(s): PROCALCITON, LATICACIDVEN in the last 168 hours.  No results found for this or any previous visit (from the past 240 hour(s)).       Scheduled Meds: . insulin aspart  0-5 Units Subcutaneous QHS  . insulin aspart  0-9 Units Subcutaneous TID WC  . triamcinolone cream   Topical BID   Continuous Infusions: . cefTRIAXone (ROCEPHIN)  IV Stopped (10/29/17 1715)  . vancomycin 1,500 mg (10/30/17 0147)     LOS: 2 days        Darlin Droparole N Phil Corti, MD [facility] Pager [phone]  If 7PM-7AM, please contact night-coverage [email or web address] Password [email or web address] 10/30/2017, 8:36 AM

## 2017-10-31 DIAGNOSIS — R21 Rash and other nonspecific skin eruption: Secondary | ICD-10-CM

## 2017-10-31 LAB — GLUCOSE, CAPILLARY
GLUCOSE-CAPILLARY: 87 mg/dL (ref 65–99)
GLUCOSE-CAPILLARY: 75 mg/dL (ref 65–99)
Glucose-Capillary: 101 mg/dL — ABNORMAL HIGH (ref 65–99)
Glucose-Capillary: 105 mg/dL — ABNORMAL HIGH (ref 65–99)

## 2017-10-31 MED ORDER — CAMPHOR-MENTHOL 0.5-0.5 % EX LOTN
TOPICAL_LOTION | CUTANEOUS | Status: DC | PRN
  Filled 2017-10-31: qty 222

## 2017-10-31 MED ORDER — DOXYCYCLINE HYCLATE 100 MG PO TABS
100.0000 mg | ORAL_TABLET | Freq: Two times a day (BID) | ORAL | Status: DC
  Administered 2017-10-31 – 2017-11-01 (×2): 100 mg via ORAL
  Filled 2017-10-31 (×2): qty 1

## 2017-10-31 MED ORDER — PREDNISONE 50 MG PO TABS
50.0000 mg | ORAL_TABLET | Freq: Every day | ORAL | Status: DC
  Administered 2017-10-31: 50 mg via ORAL
  Filled 2017-10-31 (×2): qty 1

## 2017-10-31 NOTE — Progress Notes (Signed)
Patient ID: Blake Flores, male   DOB: [birth year], 42 y.o.   MRN: [id number]  PROGRESS NOTE    Blake Flores  WGN:[id number] DOB: 1976-07-08 DOA: 10/27/2017 PCP: Patient, No Pcp Per   Brief Narrative:  42 year old male with no past medical history presented with worsening left arm pain and diffuse itching for the last 2 weeks.  He was admitted with left upper extremity cellulitis with concerns for multiple bedbugs bite and started on IV clindamycin.  Subjective:  10/31/17: Seen and examined reports itching and rash in left arm improving. Had similar rash 2-3 years ago can't remember how it resolved.  Assessment & Plan:   Active Problems:   Cellulitis   Suspected Cellulitis of the left upper extremity with concerning folliculitis most likely due to multiple bedbugs bite/skin irritation -10/31/17 stop IV antibiotics -started doxycycline -Continue pain management and antihistamines -10/31/17 started prednisone taper -Left upper extremity duplex negative for DVT -continue triamcinolone cream -10/29/17 HIV non reactive  Bradycardia, asymptomatic -improving -10/31/17 persistent with HR 57 -continue to monitor on telemetry   DVT prophylaxis: Early ambulation/SCDs/sq lovenox Code Status: Full Family Communication: None at bedside Disposition Plan: to be determined  Consultants: Dermatology service not available.  Procedures: None  Antimicrobials: doxycycline   Objective: Vitals:   10/30/17 2312 10/31/17 0500 10/31/17 0628 10/31/17 1354  BP: (!) 98/51  (!) 90/39 (!) 127/56  Pulse: (!) 52  (!) 56 (!) 57  Resp: 18  19 18   Temp: (!) 97.4 F (36.3 C)  97.8 F (36.6 C) 98 F (36.7 C)  TempSrc: Oral  Oral Oral  SpO2: 100%  99% 98%  Weight:  75.6 kg (166 lb 10.7 oz)    Height:        Intake/Output Summary (Last 24 hours) at 10/31/2017 1858 Last data filed at 10/31/2017 1559 Gross per 24 hour  Intake 666 ml  Output 300 ml  Net 366 ml   Filed Weights   10/29/17 0500 10/30/17 0644  10/31/17 0500  Weight: 72.4 kg (159 lb 9.8 oz) 75 kg (165 lb 5.5 oz) 75.6 kg (166 lb 10.7 oz)    Examination: 10/31/17 patient seen and examined. No change from prior exam.  General exam: Appears calm and comfortable  Respiratory system: Bilateral decreased breath sound at bases Cardiovascular system: S1 & S2 heard, rate controlled  gastrointestinal system: Abdomen is nondistended, soft and nontender. Normal bowel sounds heard. Skin: Left upper extremity warmth and slight swelling noted with diffuse folliculitis.  Multiple skin abrasion noted. Also left sided  Abdominal diffused folliculitis.   Data Reviewed: I have personally reviewed following labs and imaging studies  CBC: Recent Labs  Lab 10/27/17 1330 10/29/17 0415  WBC 11.3* 10.7*  NEUTROABS 4.5 5.0  HGB 15.2 14.0  HCT 43.8 41.8  MCV 81.9 82.3  PLT 315 294   Basic Metabolic Panel: Recent Labs  Lab 10/27/17 1330 10/28/17 0356 10/29/17 0415  NA 139 140 139  K 4.2 3.8 4.1  CL 104 106 106  CO2 26 23 23   GLUCOSE 77 98 94  BUN 14 14 9   CREATININE 0.88 0.89 0.90  CALCIUM 8.7* 8.3* 8.1*  MG  --   --  2.0   GFR: Estimated Creatinine Clearance: 109.3 mL/min (by C-G formula based on SCr of 0.9 mg/dL). Liver Function Tests: Recent Labs  Lab 10/28/17 0356 10/29/17 0415  AST 17 19  ALT 18 16*  ALKPHOS 72 74  BILITOT 0.7 0.4  PROT 5.5* 5.4*  ALBUMIN 3.0* 3.0*  No results for input(s): LIPASE, AMYLASE in the last 168 hours. No results for input(s): AMMONIA in the last 168 hours. Coagulation Profile: No results for input(s): INR, PROTIME in the last 168 hours. Cardiac Enzymes: No results for input(s): CKTOTAL, CKMB, CKMBINDEX, TROPONINI in the last 168 hours. BNP (last 3 results) No results for input(s): PROBNP in the last 8760 hours. HbA1C: No results for input(s): HGBA1C in the last 72 hours. CBG: Recent Labs  Lab 10/30/17 1559 10/30/17 2310 10/31/17 0811 10/31/17 1150 10/31/17 1558  GLUCAP 91 98 87 75  101*   Lipid Profile: No results for input(s): CHOL, HDL, LDLCALC, TRIG, CHOLHDL, LDLDIRECT in the last 72 hours. Thyroid Function Tests: No results for input(s): TSH, T4TOTAL, FREET4, T3FREE, THYROIDAB in the last 72 hours. Anemia Panel: No results for input(s): VITAMINB12, FOLATE, FERRITIN, TIBC, IRON, RETICCTPCT in the last 72 hours. Sepsis Labs: No results for input(s): PROCALCITON, LATICACIDVEN in the last 168 hours.  No results found for this or any previous visit (from the past 240 hour(s)).       Scheduled Meds: . doxycycline  100 mg Oral Q12H  . enoxaparin (LOVENOX) injection  40 mg Subcutaneous Q24H  . insulin aspart  0-5 Units Subcutaneous QHS  . insulin aspart  0-9 Units Subcutaneous TID WC  . triamcinolone cream   Topical BID   Continuous Infusions:    LOS: 3 days        Blake Droparole N Erial Fikes, MD [facility] Pager [phone]  If 7PM-7AM, please contact night-coverage [email or web address] Password [email or web address] 10/31/2017, 6:58 PM

## 2017-10-31 NOTE — Progress Notes (Signed)
Complete AD form. Patient have three copies including original one, and unit secretary has one copy.    10/31/17 1400  Clinical Encounter Type  Visited With Patient and family together  Visit Type Follow-up  Consult/Referral To Chaplain  Advance Directives (For Healthcare)  Does Patient Have a Medical Advance Directive? Yes  Type of Estate agentAdvance Directive Healthcare Power of [place] Neck[place];Living will  Copy of Living Will in Chart? Yes  Phebe Collaonna S Giulliana Mcroberts, Chaplain

## 2017-11-01 DIAGNOSIS — L739 Follicular disorder, unspecified: Secondary | ICD-10-CM

## 2017-11-01 DIAGNOSIS — L0390 Cellulitis, unspecified: Secondary | ICD-10-CM

## 2017-11-01 DIAGNOSIS — L03818 Cellulitis of other sites: Secondary | ICD-10-CM

## 2017-11-01 LAB — GLUCOSE, CAPILLARY
GLUCOSE-CAPILLARY: 123 mg/dL — AB (ref 65–99)
Glucose-Capillary: 91 mg/dL (ref 65–99)

## 2017-11-01 MED ORDER — DOXYCYCLINE HYCLATE 100 MG PO TABS: 100.0000 mg | ORAL_TABLET | Freq: Two times a day (BID) | ORAL | 0 refills | Status: DC

## 2017-11-01 MED ORDER — PREDNISONE 10 MG PO TABS: ORAL_TABLET | ORAL | 0 refills | Status: DC

## 2017-11-01 MED ORDER — HYDROXYZINE HCL 25 MG PO TABS: 25.0000 mg | ORAL_TABLET | Freq: Three times a day (TID) | ORAL | 0 refills | Status: DC | PRN

## 2017-11-01 MED ORDER — CAMPHOR-MENTHOL 0.5-0.5 % EX LOTN: TOPICAL_LOTION | CUTANEOUS | 0 refills | Status: DC | PRN

## 2017-11-01 NOTE — Progress Notes (Signed)
Patient discharged prior to being seen by Case Manager; Noted pt self pay; Alexis GoodellB Cid [employer] RN,MHA,BSN [phone]

## 2017-11-01 NOTE — Discharge Instructions (Signed)

## 2017-11-01 NOTE — Discharge Summary (Signed)
Discharge Summary  Blake Flores UXL:[id number] DOB: 03/08/1976  PCP: Patient, No Pcp Per  Admit date: 10/27/2017 Discharge date: 11/01/2017  Time spent: 25 minutes  Recommendations for Outpatient Follow-up:  1. Follow up with PCP post hospitalization 2. Take your medications as prescribed   Discharge Diagnoses:  Active Hospital Problems   Diagnosis Date Noted  . Cellulitis 10/27/2017    Resolved Hospital Problems  No resolved problems to display.    Discharge Condition: stable   Diet recommendation: Resume previous diet  Vitals:   10/31/17 2212 11/01/17 0638  BP: 122/62 117/62  Pulse: (!) 48 (!) 57  Resp: 20   Temp: (!) 97.5 F (36.4 C) 98.5 F (36.9 C)  SpO2: 99% 98%    History of present illness:  42 year old male with no past medical history presented with worsening left arm pain and diffuse itching for the last 2 weeks.  He was admitted with left upper extremity cellulitis with concerns for multiple bedbugs bite and started on IV clindamycin and antihistamines. Prednisone added, antibiotics switched to po doxycycline. Symptoms continued to improve.  On the day of discharge the patient was hemodynamically stable. He will need to follow up with PCP post hospitalization and take his medications as prescribed.   Hospital Course:  Active Problems:   Cellulitis   Procedures:  None  Consultations:  None  Discharge Exam: BP 117/62 (BP Location: Right Arm)   Pulse (!) 57   Temp 98.5 F (36.9 C) (Oral)   Resp 20   Ht 5\' 10"  (1.778 m)   Wt 76.5 kg (168 lb 10.4 oz)   SpO2 98%   BMI 24.20 kg/m   General: 42 yo AAM WD WN NAD A&O x3  Cardiovascular: RRR no rubs or gallops Respiratory: CTA no wheezes or rales   Discharge Instructions You were cared for by a hospitalist during your hospital stay. If you have any questions about your discharge medications or the care you received while you were in the hospital after you are discharged, you can call the unit  and asked to speak with the hospitalist on call if the hospitalist that took care of you is not available. Once you are discharged, your primary care physician will handle any further medical issues. Please note that NO REFILLS for any discharge medications will be authorized once you are discharged, as it is imperative that you return to your primary care physician (or establish a relationship with a primary care physician if you do not have one) for your aftercare needs so that they can reassess your need for medications and monitor your lab values.   Allergies as of 11/01/2017   No Known Allergies     Medication List    TAKE these medications   camphor-menthol lotion Commonly known as:  SARNA Apply topically as needed for itching. Left arm   doxycycline 100 MG tablet Commonly known as:  VIBRA-TABS Take 1 tablet (100 mg total) by mouth every 12 (twelve) hours.   hydrOXYzine 25 MG tablet Commonly known as:  ATARAX/VISTARIL Take 1 tablet (25 mg total) by mouth every 8 (eight) hours as needed for itching.   predniSONE 10 MG tablet Commonly known as:  DELTASONE Take 1 tablet once a day for 5 days Start taking on:  11/02/2017      No Known Allergies Follow-up Information    [facility] Follow up.   Specialty:  Family Medicine Why:  Call tomorrow after 9am to scheduled an Emergency Department follow  up visit and to establsih primary care Contact information: [address] [place]via Heaps [facility] [address] [phone]           The results of significant diagnostics from this hospitalization (including imaging, microbiology, ancillary and laboratory) are listed below for reference.    Significant Diagnostic Studies:   Microbiology: No results found for this or any previous visit (from the past 240 hour(s)).   Labs: Basic Metabolic Panel: Recent Labs  Lab 10/27/17 1330 10/28/17 0356 10/29/17 0415  NA 139 140 139  K 4.2 3.8 4.1  CL  104 106 106  CO2 26 23 23   GLUCOSE 77 98 94  BUN 14 14 9   CREATININE 0.88 0.89 0.90  CALCIUM 8.7* 8.3* 8.1*  MG  --   --  2.0   Liver Function Tests: Recent Labs  Lab 10/28/17 0356 10/29/17 0415  AST 17 19  ALT 18 16*  ALKPHOS 72 74  BILITOT 0.7 0.4  PROT 5.5* 5.4*  ALBUMIN 3.0* 3.0*   No results for input(s): LIPASE, AMYLASE in the last 168 hours. No results for input(s): AMMONIA in the last 168 hours. CBC: Recent Labs  Lab 10/27/17 1330 10/29/17 0415  WBC 11.3* 10.7*  NEUTROABS 4.5 5.0  HGB 15.2 14.0  HCT 43.8 41.8  MCV 81.9 82.3  PLT 315 294   Cardiac Enzymes: No results for input(s): CKTOTAL, CKMB, CKMBINDEX, TROPONINI in the last 168 hours. BNP: BNP (last 3 results) No results for input(s): BNP in the last 8760 hours.  ProBNP (last 3 results) No results for input(s): PROBNP in the last 8760 hours.  CBG: Recent Labs  Lab 10/31/17 1150 10/31/17 1558 10/31/17 2214 11/01/17 0819 11/01/17 1300  GLUCAP 75 101* 105* 123* 91       Signed:  Darlin Drop, MD [facility] 11/01/2017, 2:00 PM

## 2017-11-14 ENCOUNTER — Ambulatory Visit: Payer: Self-pay | Admitting: Family Medicine

## 2018-04-24 ENCOUNTER — Emergency Department (HOSPITAL_COMMUNITY)
Admission: EM | Admit: 2018-04-24 | Discharge: 2018-04-24 | Disposition: A | Discharge status: Home / Self Care | Payer: Self-pay | Attending: Emergency Medicine | Admitting: Emergency Medicine

## 2018-04-24 ENCOUNTER — Encounter (HOSPITAL_COMMUNITY): Payer: Self-pay

## 2018-04-24 ENCOUNTER — Emergency Department (HOSPITAL_COMMUNITY): Payer: Self-pay

## 2018-04-24 ENCOUNTER — Other Ambulatory Visit: Payer: Self-pay

## 2018-04-24 DIAGNOSIS — Z79899 Other long term (current) drug therapy: Secondary | ICD-10-CM | POA: Insufficient documentation

## 2018-04-24 DIAGNOSIS — F17210 Nicotine dependence, cigarettes, uncomplicated: Secondary | ICD-10-CM | POA: Insufficient documentation

## 2018-04-24 DIAGNOSIS — M25512 Pain in left shoulder: Secondary | ICD-10-CM | POA: Insufficient documentation

## 2018-04-24 MED ORDER — IBUPROFEN 400 MG PO TABS: 400.0000 mg | ORAL_TABLET | Freq: Four times a day (QID) | ORAL | 0 refills | Status: DC | PRN

## 2018-04-24 MED ORDER — LIDOCAINE 5 % EX PTCH: 1.0000 | MEDICATED_PATCH | CUTANEOUS | 0 refills | Status: DC

## 2018-04-24 NOTE — Discharge Instructions (Addendum)
You may use the anti-inflammatory, ibuprofen as prescribed.  You may also use the Lidoderm patch over your left shoulder for pain as prescribed. Please use the resources below to find a primary care provider and follow-up with them regarding your visit today. Please return to the emergency department for any new or worsening symptoms.  Contact a doctor if: Your pain gets worse. Medicine does not help your pain. You have new pain in your arm, hand, or fingers. Get help right away if: Your arm, hand, or fingers: Tingle. Are numb. Are swollen. Are painful. Turn white or blue.  RESOURCE GUIDE  Chronic Pain Problems: Contact [facility]  [phone] Patients need to be referred by their primary care doctor.  Insufficient Money for Medicine: Contact United Way:  call "211" or [facility] 816-625-1627.  No Primary Care Doctor: Call Health Connect  [phone] - can help you locate a primary care doctor that  accepts your insurance, provides certain services, etc. Physician Referral Service- [phone]  Agencies that provide inexpensive medical care: [facility]  [phone] [facility]  [phone] [facility]  [phone] [facility]  [phone] [facility]  [phone] [facility]  [phone]  Medicaid-accepting [facility] Providers: [facility]- [address]  [phone], Mon-Fri 9am-7pm, Sat 9am-1pm [facility]- [address] [place], [address] [place]  [phone] [facility]- [address], [address] [place]  [phone] [facility]- [address]  [phone] Renaye Rakers- [address] [place], [address], [phone]  Only accepts [place] patients after they have their name  applied to their card  Self Pay (no insurance) in [facility]: Sickle Cell Patients: Dr Willey Blade, [facility] Internal  Medicine  [address] [place], [phone] [facility] Care- [address] [place]  [phone]       [facility] [place]- [address]       -     [facility]- see information above (Speak to [employer] if you do not have insurance)       -  [facility]- [address] [place], [phone]       -  [facility]- [address],  [phone]       -  [facility]- [address], [phone]       -  Dr Julio Sicks-  [address], [place], [phone]       -  [facility]- [address] [phone]       -  [facility]- [address], [phone], also [address] [phone]       -    [facility]- [address] [place], [phone], 1st & 3rd Saturday   every month, 10am-1pm  1) Find a Doctor and Pay Out of Pocket Although you won't have to find out who is covered by your insurance plan, it is a good idea to ask around and get recommendations. You will then need to call the office and see if the doctor you have chosen will accept you as a new patient and what types of options they offer for patients who are self-pay. Some doctors offer discounts or will set up payment plans for their patients who do not have insurance,  but you will need to ask so you aren't surprised when you get to your appointment.  2) Contact Your Local Health Department Not all health departments have doctors that can see patients for sick visits, but many do, so it is worth a call to see if yours does. If you don't know where your local health department is, you can check in your phone book. The CDC also has a tool to help you locate your state's health department, and many state websites also have listings of all of their local health departments.  3) Find a Walk-in Clinic If your illness is not likely to be very severe or complicated, you may want to try a walk in clinic. These are popping up all over the  country in pharmacies, drugstores, and shopping centers. They're usually staffed by nurse practitioners or physician assistants that have been trained to treat common illnesses and complaints. They're usually fairly quick and inexpensive. However, if you have serious medical issues or chronic medical problems, these are probably not your best option  STD Testing [facility] Department of [facility], STD Clinic, [address] [place], phone [phone] or [phone].  Monday - Friday, call for an appointment. [facility] Department of [employer], STD Clinic, [place] [address], [place], phone [phone] or [phone].  Monday - Friday, call for an appointment.  Abuse/Neglect: [facility] Child Abuse Hotline 239-773-7579 [facility] Child Abuse Hotline (832) 388-4863 (After Hours)  Emergency Shelter:  [facility] [phone]  Maternity Homes: Room at the [facility] [phone] Rebeca Alert Services [phone]  MRSA Hotline #:   757-467-2217  [facility] Resources  [facility]  United Way [facility] Dept. [address]                 [address]         [address] [place] [address]  Blondell Reveal Phone:  [phone]                                  Phone:  [phone]                   Phone:  [phone]  [facility], [phone] [facility]- [phone]       -     [facility] in [place], [address]                                  [phone], [facility] Child Abuse Hotline 850-574-1219 or (952)326-0703 (After Hours)   Behavioral Health Services  Substance Abuse Resources: Alcohol and Drug Services  701-737-6992 [facility]  8635459134 [facility] [phone] [facility] [phone] Residential & Outpatient Substance Abuse Program  386-424-4921  Psychological Services: Tressie Ellis  Behavioral Health  [phone] [facility]  [phone] [facility], [phone] [place] [address], [place], ACCESS LINE: [phone] or [phone], [email or web address]  Dental Assistance  If unable to pay or uninsured, contact:  [facility] or [facility] to become qualified for the adult dental clinic.  Patients with Medicaid: [facility] [phone], [phone] [address], [phone]  If unable to pay, or uninsured, contact [facility] [phone]) or [facility] [phone] in [place], [phone] in [facility]) to become qualified for the adult dental clinic   Other Low-Cost Community Dental Services: [facility]- [address], [place], [place], [phone], [phone], [phone], 2nd and 4th Thursday of the month at 6:30am.  10 clients each day by appointment, can sometimes see walk-in patients if someone does not show for an appointment. [facility]- [address], [place], [place], [phone], [phone] [facility]- [address], [place], [place], [phone], [phone] [facility]- [phone] [facility]- [phone] [phone]- [phone]
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
## 2018-04-24 NOTE — ED Triage Notes (Signed)
Pt states that he works [employer], thinks that he hurt his should at work c/o left should pain X2 weeks. Denies any radiation. Using icy hot at home with no relief.

## 2018-11-13 ENCOUNTER — Other Ambulatory Visit: Payer: Self-pay

## 2018-11-13 ENCOUNTER — Encounter (HOSPITAL_COMMUNITY): Payer: Self-pay | Admitting: Emergency Medicine

## 2018-11-13 ENCOUNTER — Emergency Department (HOSPITAL_COMMUNITY): Payer: Self-pay

## 2018-11-13 ENCOUNTER — Emergency Department (HOSPITAL_COMMUNITY)
Admission: EM | Admit: 2018-11-13 | Discharge: 2018-11-13 | Disposition: A | Discharge status: Home / Self Care | Payer: Self-pay | Attending: Emergency Medicine | Admitting: Emergency Medicine

## 2018-11-13 DIAGNOSIS — W450XXA Nail entering through skin, initial encounter: Secondary | ICD-10-CM | POA: Insufficient documentation

## 2018-11-13 DIAGNOSIS — Z23 Encounter for immunization: Secondary | ICD-10-CM | POA: Insufficient documentation

## 2018-11-13 DIAGNOSIS — S91331A Puncture wound without foreign body, right foot, initial encounter: Secondary | ICD-10-CM | POA: Insufficient documentation

## 2018-11-13 DIAGNOSIS — Y9301 Activity, walking, marching and hiking: Secondary | ICD-10-CM | POA: Insufficient documentation

## 2018-11-13 DIAGNOSIS — Y929 Unspecified place or not applicable: Secondary | ICD-10-CM | POA: Insufficient documentation

## 2018-11-13 DIAGNOSIS — Y999 Unspecified external cause status: Secondary | ICD-10-CM | POA: Insufficient documentation

## 2018-11-13 DIAGNOSIS — F17210 Nicotine dependence, cigarettes, uncomplicated: Secondary | ICD-10-CM | POA: Insufficient documentation

## 2018-11-13 MED ORDER — CIPROFLOXACIN HCL 500 MG PO TABS: 500.0000 mg | ORAL_TABLET | Freq: Two times a day (BID) | ORAL | 0 refills | Status: AC

## 2018-11-13 MED ORDER — TETANUS-DIPHTH-ACELL PERTUSSIS 5-2.5-18.5 LF-MCG/0.5 IM SUSP
0.5000 mL | Freq: Once | INTRAMUSCULAR | Status: AC
  Administered 2018-11-13: 0.5 mL via INTRAMUSCULAR
  Filled 2018-11-13: qty 0.5

## 2018-11-13 NOTE — Discharge Instructions (Addendum)
You have been diagnosed today with Puncture Wound of the Right Foot.  At this time there does not appear to be the presence of an emergent medical condition, however there is always the potential for conditions to change. Please read and follow the below instructions.  Please return to the Emergency Department immediately for any new or worsening symptoms or if your symptoms. Please be sure to follow up with your Primary Care Provider within one week regarding your visit today; please call their office to schedule an appointment even if you are feeling better for a follow-up visit. Please follow-up with a foot specialist, Dr. Logan Bores on your discharge paperwork.  Call his office today to schedule a follow-up appointment regarding your puncture wound.  Specialist follow-up as very important to avoid complications. Please take the antibiotic ciprofloxacin as prescribed to avoid infection. Rest and elevation will help with your pain.  You may also use over-the-counter anti-inflammatories such as Tylenol or ibuprofen as directed on the packaging.  Get help right away if: You develop severe swelling around your wound. Your pain suddenly increases and is severe. You develop painful skin lumps. You have a red streak going away from your wound. The wound is on your hand or foot and you: Cannot properly move a finger or toe. Notice that your fingers or toes look pale or bluish.  Please read the additional information packets attached to your discharge summary.  Do not take your medicine if  develop an itchy rash, swelling in your mouth or lips, or difficulty breathing.

## 2018-11-13 NOTE — ED Notes (Signed)
Patient verbalizes understanding of discharge instructions. Opportunity for questioning and answers were provided. Armband removed by staff, pt discharged from ED home via bus.  

## 2018-11-13 NOTE — ED Triage Notes (Signed)
Pt reports stepping on a nail 2 weeks ago. Pt states it hurts bad today which is why he did not come to the hospital or seek medical attention sooner.

## 2018-11-13 NOTE — ED Provider Notes (Signed)
[facility] EMERGENCY DEPARTMENT Provider Note   CSN: [id number] Arrival date & time: 11/13/18  0747    History   Chief Complaint Chief Complaint  Patient presents with  . Puncture Wound    HPI Blake Hualijah Flores is a 43 y.o. male presenting today for concern of right foot pain.  Patient states that approximately 2 weeks ago he stepped on a nail through his shoe.  Patient removed nail and cleaned the area thoroughly at home.  He states it has been cleaning it daily however has had increasing pain.  Patient describes his pain as a mild throbbing pain that is only present when he is ambulating and is improved with rest.  Patient states that he was able to remove the nail fully intact.  He denies history of fever, nausea/vomiting, color changes/erythema, drainage or pain with toe movement, ankle movement or knee movement.  Patient states that his tetanus shot is not up-to-date.    HPI  History reviewed. No pertinent past medical history.  Patient Active Problem List   Diagnosis Date Noted  . Cellulitis 10/27/2017    History reviewed. No pertinent surgical history.      Home Medications    Prior to Admission medications   Medication Sig Start Date End Date Taking? Authorizing Provider  camphor-menthol [facility]) lotion Apply topically as needed for itching. Left arm 11/01/17   Darlin DropHall, Carole N, DO  ciprofloxacin (CIPRO) 500 MG tablet Take 1 tablet (500 mg total) by mouth every 12 (twelve) hours for 7 days. 11/13/18 11/20/18  Harlene SaltsMorelli, Lyle Leisner A, PA-C  doxycycline (VIBRA-TABS) 100 MG tablet Take 1 tablet (100 mg total) by mouth every 12 (twelve) hours. 11/01/17   Darlin DropHall, Carole N, DO  hydrOXYzine (ATARAX/VISTARIL) 25 MG tablet Take 1 tablet (25 mg total) by mouth every 8 (eight) hours as needed for itching. 11/01/17   Darlin DropHall, Carole N, DO  ibuprofen (ADVIL,MOTRIN) 400 MG tablet Take 1 tablet (400 mg total) by mouth every 6 (six) hours as needed. 04/24/18   Harlene SaltsMorelli, Nishant Schrecengost A, PA-C    lidocaine (LIDODERM) 5 % Place 1 patch onto the skin daily. Remove & Discard patch within 12 hours or as directed by MD 04/24/18   Bill SalinasMorelli, Mystery Schrupp A, PA-C  predniSONE (DELTASONE) 10 MG tablet Take 1 tablet once a day for 5 days 11/02/17   Darlin DropHall, Carole N, DO    Family History No family history on file.  Social History Social History   Tobacco Use  . Smoking status: Current Every Day Smoker    Packs/day: 1.00    Types: Cigarettes  . Smokeless tobacco: Never Used  Substance Use Topics  . Alcohol use: Yes  . Drug use: Not Currently     Allergies   Patient has no known allergies.   Review of Systems Review of Systems  Constitutional: Negative.  Negative for chills and fever.  Gastrointestinal: Negative.  Negative for abdominal pain, diarrhea, nausea and vomiting.  Musculoskeletal: Negative.  Negative for arthralgias and myalgias.  Skin: Positive for wound. Negative for color change.  Neurological: Negative.  Negative for weakness, numbness and headaches.   Physical Exam Updated Vital Signs BP 120/62 (BP Location: Right Arm)   Pulse (!) 55   Temp 97.7 F (36.5 C) (Oral)   Resp 18   Ht 5\' 9"  (1.753 m)   Wt 79.4 kg   SpO2 98%   BMI 25.84 kg/m   Physical Exam Constitutional:      General: He is not in acute  distress.    Appearance: Normal appearance. He is well-developed. He is not ill-appearing or diaphoretic.  HENT:     Head: Normocephalic and atraumatic.     Right Ear: External ear normal.     Left Ear: External ear normal.     Nose: Nose normal.  Eyes:     General: Vision grossly intact. Gaze aligned appropriately.     Pupils: Pupils are equal, round, and reactive to light.  Neck:     Musculoskeletal: Normal range of motion.     Trachea: Trachea and phonation normal. No tracheal deviation.  Cardiovascular:     Rate and Rhythm: Normal rate and regular rhythm.     Pulses: Normal pulses.          Dorsalis pedis pulses are 2+ on the right side.       Posterior  tibial pulses are 2+ on the right side.  Pulmonary:     Effort: Pulmonary effort is normal. No respiratory distress.  Abdominal:     General: There is no distension.     Palpations: Abdomen is soft.     Tenderness: There is no abdominal tenderness. There is no guarding or rebound.  Musculoskeletal: Normal range of motion.     Right knee: Normal.     Right ankle: Normal.     Right lower leg: Normal.     Right foot: Normal range of motion and normal capillary refill. Tenderness present. No swelling, crepitus or deformity.       Feet:  Feet:     Right foot:     Protective Sensation: 3 sites tested. 3 sites sensed.     Comments: Patient with small puncture wound of the right foot.  Please see picture below.  No surrounding erythema, induration or fluctuance.  No drainage.  No increased warmth. Skin:    General: Skin is warm and dry.     Capillary Refill: Capillary refill takes less than 2 seconds.  Neurological:     Mental Status: He is alert.     GCS: GCS eye subscore is 4. GCS verbal subscore is 5. GCS motor subscore is 6.     Comments: Speech is clear and goal oriented, follows commands Major Cranial nerves without deficit, no facial droop Moves extremities without ataxia, coordination intact  Psychiatric:        Behavior: Behavior normal.        ED Treatments / Results  Labs (all labs ordered are listed, but only abnormal results are displayed) Labs Reviewed - No data to display  EKG None  Radiology Dg Foot Complete Right  Result Date: 11/13/2018 CLINICAL DATA:  Stepped on nail 1 month ago.  Pain. EXAM: RIGHT FOOT COMPLETE - 3+ VIEW COMPARISON:  No prior. FINDINGS: Soft tissue structures are unremarkable. No radiopaque foreign body. No acute bony abnormality. No bony erosions. Degenerative changes first MTP joint. IMPRESSION: No radiopaque foreign body. No acute bony abnormality. No bony erosions. Degenerative changes first MTP joint. Electronically Signed   By: Maisie Fus   Register   On: 11/13/2018 08:18    Procedures Procedures (including critical care time)  Medications Ordered in ED Medications  Tdap (BOOSTRIX) injection 0.5 mL (0.5 mLs Intramuscular Given 11/13/18 0757)   Initial Impression / Assessment and Plan / ED Course  I have reviewed the triage vital signs and the nursing notes.  Pertinent labs & imaging results that were available during my care of the patient were reviewed by me and considered in my medical decision  making (see chart for details).       43 year old male presenting today for right foot pain.  Patient states that he stepped on a nail approximately 2 weeks ago through his shoe.  He has been cleaning the area daily.  He denies pain with motion of the toe, ankle or knee.  No surrounding erythema.  No induration fluctuance increased warmth or drainage.  He has been ambulating for the past 2 weeks with some increase in pain.  No history of fever, nausea vomiting or systemic infectious symptoms. Extremity neurovascularly intact; no signs of infection, septic joint, DVT, compartment syndrome. Patient with full ROM and 5/5 strength with all movements.  Tdap updated DG right foot: IMPRESSION: No radiopaque foreign body. No acute bony abnormality. No bony erosions. Degenerative changes first MTP joint.  - Patient informed of x-ray findings and need for podiatry follow-up.  Cipro has been prescribed for infection prophylaxis.  Referral given and prompt podiatry follow-up encouraged.  At this time there does not appear to be any evidence of an acute emergency medical condition and the patient appears stable for discharge with appropriate outpatient follow up. Diagnosis was discussed with patient who verbalizes understanding of care plan and is agreeable to discharge. I have discussed return precautions with patient who verbalizes understanding of return precautions. Patient strongly encouraged to follow-up with their PCP and podiatry. All  questions answered.  Patient's case discussed with Dr. Patria Mane who agrees with plan to discharge with Cipro and follow-up.   Note: Portions of this report may have been transcribed using voice recognition software. Every effort was made to ensure accuracy; however, inadvertent computerized transcription errors may still be present. Final Clinical Impressions(s) / ED Diagnoses   Final diagnoses:  Puncture wound of right foot, initial encounter    ED Discharge Orders         Ordered    ciprofloxacin (CIPRO) 500 MG tablet  Every 12 hours     11/13/18 0938           Bill Salinas, PA-C 11/13/18 1022    Azalia Bilis, MD 11/14/18 [phone]

## 2020-02-23 ENCOUNTER — Emergency Department (HOSPITAL_COMMUNITY)
Admission: EM | Admit: 2020-02-23 | Discharge: 2020-02-23 | Disposition: A | Discharge status: Home / Self Care | Payer: PRIVATE HEALTH INSURANCE | Attending: Emergency Medicine | Admitting: Emergency Medicine

## 2020-02-23 ENCOUNTER — Encounter (HOSPITAL_COMMUNITY): Payer: Self-pay | Admitting: Emergency Medicine

## 2020-02-23 ENCOUNTER — Other Ambulatory Visit: Payer: Self-pay

## 2020-02-23 ENCOUNTER — Emergency Department (HOSPITAL_COMMUNITY): Payer: PRIVATE HEALTH INSURANCE

## 2020-02-23 DIAGNOSIS — R1032 Left lower quadrant pain: Secondary | ICD-10-CM | POA: Insufficient documentation

## 2020-02-23 DIAGNOSIS — R197 Diarrhea, unspecified: Secondary | ICD-10-CM | POA: Insufficient documentation

## 2020-02-23 DIAGNOSIS — F17210 Nicotine dependence, cigarettes, uncomplicated: Secondary | ICD-10-CM | POA: Insufficient documentation

## 2020-02-23 DIAGNOSIS — R112 Nausea with vomiting, unspecified: Secondary | ICD-10-CM | POA: Insufficient documentation

## 2020-02-23 LAB — CBC
HCT: 44 % (ref 39.0–52.0)
Hemoglobin: 15.1 g/dL (ref 13.0–17.0)
MCH: 26.8 pg (ref 26.0–34.0)
MCHC: 34.3 g/dL (ref 30.0–36.0)
MCV: 78.2 fL — ABNORMAL LOW (ref 80.0–100.0)
Platelets: 330 10*3/uL (ref 150–400)
RBC: 5.63 MIL/uL (ref 4.22–5.81)
RDW: 15.9 % — ABNORMAL HIGH (ref 11.5–15.5)
WBC: 9.8 10*3/uL (ref 4.0–10.5)
nRBC: 0 % (ref 0.0–0.2)

## 2020-02-23 LAB — LIPASE, BLOOD: Lipase: 22 U/L (ref 11–51)

## 2020-02-23 LAB — URINALYSIS, COMPLETE (UACMP) WITH MICROSCOPIC
Bacteria, UA: NONE SEEN
Glucose, UA: NEGATIVE mg/dL
Ketones, ur: NEGATIVE mg/dL
Leukocytes,Ua: NEGATIVE
Nitrite: NEGATIVE
Protein, ur: >=300 mg/dL — AB
Specific Gravity, Urine: 1.037 — ABNORMAL HIGH (ref 1.005–1.030)
pH: 5 (ref 5.0–8.0)

## 2020-02-23 LAB — COMPREHENSIVE METABOLIC PANEL
ALT: 28 U/L (ref 0–44)
AST: 20 U/L (ref 15–41)
Albumin: 3.1 g/dL — ABNORMAL LOW (ref 3.5–5.0)
Alkaline Phosphatase: 70 U/L (ref 38–126)
Anion gap: 12 (ref 5–15)
BUN: 9 mg/dL (ref 6–20)
CO2: 24 mmol/L (ref 22–32)
Calcium: 8.9 mg/dL (ref 8.9–10.3)
Chloride: 101 mmol/L (ref 98–111)
Creatinine, Ser: 0.87 mg/dL (ref 0.61–1.24)
GFR calc Af Amer: >60 mL/min (ref >60)
GFR calc non Af Amer: >60 mL/min (ref >60)
Glucose, Bld: 97 mg/dL (ref 70–99)
Potassium: 3.1 mmol/L — ABNORMAL LOW (ref 3.5–5.1)
Sodium: 137 mmol/L (ref 135–145)
Total Bilirubin: 0.6 mg/dL (ref 0.3–1.2)
Total Protein: 6.8 g/dL (ref 6.5–8.1)

## 2020-02-23 MED ORDER — IOHEXOL 300 MG/ML  SOLN
100.0000 mL | Freq: Once | INTRAMUSCULAR | Status: AC | PRN
  Administered 2020-02-23: 100 mL via INTRAVENOUS

## 2020-02-23 MED ORDER — POTASSIUM CHLORIDE CRYS ER 20 MEQ PO TBCR
40.0000 meq | EXTENDED_RELEASE_TABLET | Freq: Once | ORAL | Status: AC
  Administered 2020-02-23: 40 meq via ORAL
  Filled 2020-02-23: qty 2

## 2020-02-23 MED ORDER — ONDANSETRON HCL 4 MG/2ML IJ SOLN
4.0000 mg | Freq: Once | INTRAMUSCULAR | Status: AC
  Administered 2020-02-23: 4 mg via INTRAVENOUS
  Filled 2020-02-23: qty 2

## 2020-02-23 MED ORDER — KETOROLAC TROMETHAMINE 15 MG/ML IJ SOLN
15.0000 mg | Freq: Once | INTRAMUSCULAR | Status: AC
Start: 2020-02-23 — End: 2020-02-23
  Administered 2020-02-23: 15 mg via INTRAVENOUS
  Filled 2020-02-23: qty 1

## 2020-02-23 MED ORDER — ONDANSETRON HCL 4 MG PO TABS: 4.0000 mg | ORAL_TABLET | Freq: Four times a day (QID) | ORAL | 0 refills | Status: DC

## 2020-02-23 NOTE — Discharge Instructions (Addendum)
You were seen today for belly pain. Your CT scan was normal and your labs were reassuring. You may have a stomach virus. It is important to drink plenty of water and follow the attached instructions. Thank you for allowing me to care for you today. Please return to the emergency department if you have new or worsening symptoms. Take your medications as instructed.

## 2020-02-23 NOTE — ED Provider Notes (Signed)
[facility] EMERGENCY DEPARTMENT Provider Note   CSN: [id number] Arrival date & time: 02/23/20  0222     History Chief Complaint  Patient presents with  . Abdominal Pain    Blake Flores is a 44 y.o. male.  Patient is a 44 year old male with no significant past medical history presents to emergency department reporting diarrhea over the past Saturday.  Reports that the pain is left lower quadrant and it gets worse with eating food.  Reports having diarrhea daily.  A small amount of vomiting this morning.  Denies blood in stool.  Denies any fever, chills, dysuria, flank pain.  He thought it might be acid reflux and he took some antacids but this did not seem to help very much        History reviewed. No pertinent past medical history.  Patient Active Problem List   Diagnosis Date Noted  . Cellulitis 10/27/2017    History reviewed. No pertinent surgical history.     No family history on file.  Social History   Tobacco Use  . Smoking status: Current Every Day Smoker    Packs/day: 1.00    Types: Cigarettes  . Smokeless tobacco: Never Used  Substance Use Topics  . Alcohol use: Yes  . Drug use: Not Currently    Home Medications Prior to Admission medications   Medication Sig Start Date End Date Taking? Authorizing Provider  bismuth subsalicylate (PEPTO BISMOL) 262 MG/15ML suspension Take 30 mLs by mouth every 6 (six) hours as needed for indigestion or diarrhea or loose stools.   Yes [provider]  ibuprofen (ADVIL,MOTRIN) 400 MG tablet Take 1 tablet (400 mg total) by mouth every 6 (six) hours as needed. Patient taking differently: Take 400 mg by mouth every 6 (six) hours as needed for mild pain.  04/24/18  Yes Harlene Salts A, PA-C  camphor-menthol [facility]) lotion Apply topically as needed for itching. Left arm Patient not taking: Reported on 02/23/2020 11/01/17   Darlin Drop, DO  doxycycline (VIBRA-TABS) 100 MG tablet Take 1 tablet (100  mg total) by mouth every 12 (twelve) hours. Patient not taking: Reported on 02/23/2020 11/01/17   Darlin Drop, DO  hydrOXYzine (ATARAX/VISTARIL) 25 MG tablet Take 1 tablet (25 mg total) by mouth every 8 (eight) hours as needed for itching. Patient not taking: Reported on 02/23/2020 11/01/17   Darlin Drop, DO  lidocaine (LIDODERM) 5 % Place 1 patch onto the skin daily. Remove & Discard patch within 12 hours or as directed by MD Patient not taking: Reported on 02/23/2020 04/24/18   Harlene Salts A, PA-C  ondansetron (ZOFRAN) 4 MG tablet Take 1 tablet (4 mg total) by mouth every 6 (six) hours. 02/23/20   Arlyn Dunning, PA-C  predniSONE (DELTASONE) 10 MG tablet Take 1 tablet once a day for 5 days Patient not taking: Reported on 02/23/2020 11/02/17   Darlin Drop, DO    Allergies    Patient has no known allergies.  Review of Systems   Review of Systems  Constitutional: Negative for chills and fever.  HENT: Negative for congestion.   Respiratory: Negative for cough and shortness of breath.   Gastrointestinal: Positive for abdominal pain, diarrhea, nausea and vomiting.  Endocrine: Negative for polyuria.  Genitourinary: Negative for discharge and dysuria.  Musculoskeletal: Negative for back pain.  Skin: Negative for rash and wound.  Allergic/Immunologic: Negative for immunocompromised state.  Neurological: Negative for dizziness.  Hematological: Does not bruise/bleed easily.  Psychiatric/Behavioral: Negative  for confusion.  All other systems reviewed and are negative.   Physical Exam Updated Vital Signs BP 135/89   Pulse (!) 55   Temp 97.7 F (36.5 C) (Oral)   Resp 16   Ht 5\' 9"  (1.753 m)   Wt 93 kg   SpO2 97%   BMI 30.27 kg/m   Physical Exam Vitals and nursing note reviewed.  Constitutional:      Appearance: Normal appearance.  HENT:     Head: Normocephalic.     Mouth/Throat:     Mouth: Mucous membranes are moist.  Eyes:     Conjunctiva/sclera: Conjunctivae normal.    Cardiovascular:     Rate and Rhythm: Normal rate.  Pulmonary:     Effort: Pulmonary effort is normal.  Abdominal:     General: Abdomen is flat. Bowel sounds are normal.     Palpations: Abdomen is soft.     Tenderness: There is abdominal tenderness in the left lower quadrant. There is guarding. There is no rebound.  Skin:    General: Skin is dry.  Neurological:     Mental Status: He is alert.  Psychiatric:        Mood and Affect: Mood normal.     ED Results / Procedures / Treatments   Labs (all labs ordered are listed, but only abnormal results are displayed) Labs Reviewed  URINALYSIS, COMPLETE (UACMP) WITH MICROSCOPIC - Abnormal; Notable for the following components:      Result Value   Color, Urine AMBER (*)    APPearance HAZY (*)    Specific Gravity, Urine 1.037 (*)    Hgb urine dipstick SMALL (*)    Bilirubin Urine SMALL (*)    Protein, ur >=300 (*)    Non Squamous Epithelial 0-5 (*)    All other components within normal limits  URINE CULTURE    EKG None  Radiology CT ABDOMEN PELVIS W CONTRAST  Result Date: 02/23/2020 CLINICAL DATA:  Intermittent abdominal pain, diarrhea and nausea for 1 week. Question diverticulitis. EXAM: CT ABDOMEN AND PELVIS WITH CONTRAST TECHNIQUE: Multidetector CT imaging of the abdomen and pelvis was performed using the standard protocol following bolus administration of intravenous contrast. CONTRAST:  161mL OMNIPAQUE IOHEXOL 300 MG/ML  SOLN COMPARISON:  None. FINDINGS: Lower chest: Dependent atelectasis bilaterally. Heart size normal. No pericardial or pleural effusion. Distal esophagus is unremarkable. Metallic fragments are seen along the left posterior pleural space and distal descending thoracic aorta. Hepatobiliary: Liver and gallbladder are unremarkable. No biliary ductal dilatation. Pancreas: Negative. Spleen: Negative. Adrenals/Urinary Tract: Adrenal glands are unremarkable. Early excretion of contrast from both kidneys. Kidneys are  otherwise unremarkable. Ureters are decompressed. Bladder is grossly unremarkable. Stomach/Bowel: Stomach, small bowel, appendix and colon are unremarkable. Vascular/Lymphatic: Atherosclerotic calcification of the aorta without aneurysm. No pathologically enlarged lymph nodes. Ileocolic mesenteric lymph nodes measure up to 7 mm. Reproductive: Prostate is normal in size. Other: No free fluid.  Mesenteries and peritoneum are unremarkable. Musculoskeletal: None. IMPRESSION: 1. No findings to explain the patient's symptoms. Specifically, no evidence of diverticulitis. 2.  Aortic atherosclerosis (ICD10-I70.0). Electronically Signed   By: Lorin Picket M.D.   On: 02/23/2020 08:52    Procedures Procedures (including critical care time)  Medications Ordered in ED Medications  potassium chloride SA (KLOR-CON) CR tablet 40 mEq (has no administration in time range)  iohexol (OMNIPAQUE) 300 MG/ML solution 100 mL (100 mLs Intravenous Contrast Given 02/23/20 0824)  ondansetron (ZOFRAN) injection 4 mg (4 mg Intravenous Given 02/23/20 0907)  ketorolac (TORADOL)  15 MG/ML injection 15 mg (15 mg Intravenous Given 02/23/20 7124)    ED Course  I have reviewed the triage vital signs and the nursing notes.  Pertinent labs & imaging results that were available during my care of the patient were reviewed by me and considered in my medical decision making (see chart for details).  Clinical Course as of Feb 22 918  Wed Feb 23, 2020  5809 49 gentleman presenting with 5 days of nausea, abdominal upset and diarrhea. Well appearing on exam with mild LLQ belly tenderness. Patient is tolerating PO and has a negative abdominal Ct scan. Labs reassuring. Symptoms may be due to GI bug/gastroenteritis. No recent abx and patient unable to give stool sample while here in ED.  Patient advised to stay hydrated. Zofran prescribed. Advised on return precautions and good PMD f/u.    [KM]    Clinical Course User Index [KM] Jeral Pinch   MDM Rules/Calculators/A&P                      Based on review of vitals, medical screening exam, lab work and/or imaging, there does not appear to be an acute, emergent etiology for the patient's symptoms. Counseled pt on good return precautions and encouraged both PCP and ED follow-up as needed.  Prior to discharge, I also discussed incidental imaging findings with patient in detail and advised appropriate, recommended follow-up in detail.  Clinical Impression: 1. Left lower quadrant abdominal pain     Disposition: Discharge  Prior to providing a prescription for a controlled substance, I independently reviewed the patient's recent prescription history on the [place] Controlled Substance Reporting System. The patient had no recent or regular prescriptions and was deemed appropriate for a brief, less than 3 day prescription of narcotic for acute analgesia.  This note was prepared with assistance of Conservation officer, historic buildings. Occasional wrong-word or sound-a-like substitutions may have occurred due to the inherent limitations of voice recognition software.  Final Clinical Impression(s) / ED Diagnoses Final diagnoses:  Left lower quadrant abdominal pain    Rx / DC Orders ED Discharge Orders         Ordered    ondansetron (ZOFRAN) 4 MG tablet  Every 6 hours     02/23/20 0902           Arlyn Dunning, PA-C 02/23/20 0919    Gerhard Munch, MD 02/26/20 2034

## 2020-02-23 NOTE — ED Notes (Signed)
Pt transported to CT ?

## 2020-02-23 NOTE — ED Triage Notes (Signed)
Patient reports intermittent abdominal pain and diarrhea X1 week. Patient in NAD.

## 2020-02-23 NOTE — ED Notes (Signed)
Labs did not cross into Epic after down time

## 2020-07-01 ENCOUNTER — Ambulatory Visit (HOSPITAL_COMMUNITY)
Admission: EM | Admit: 2020-07-01 | Discharge: 2020-07-01 | Disposition: A | Discharge status: Home / Self Care | Payer: 59 | Attending: Family Medicine | Admitting: Family Medicine

## 2020-07-01 ENCOUNTER — Other Ambulatory Visit: Payer: Self-pay

## 2020-07-01 ENCOUNTER — Encounter (HOSPITAL_COMMUNITY): Payer: Self-pay

## 2020-07-01 ENCOUNTER — Ambulatory Visit (INDEPENDENT_AMBULATORY_CARE_PROVIDER_SITE_OTHER): Payer: 59

## 2020-07-01 DIAGNOSIS — S60212A Contusion of left wrist, initial encounter: Secondary | ICD-10-CM

## 2020-07-01 DIAGNOSIS — M25532 Pain in left wrist: Secondary | ICD-10-CM

## 2020-07-01 MED ORDER — IBUPROFEN 800 MG PO TABS: 800.0000 mg | ORAL_TABLET | Freq: Three times a day (TID) | ORAL | 0 refills | Status: AC

## 2020-07-01 NOTE — ED Triage Notes (Signed)
Pt present a fall today and injured his left wrist. Sudden movements hurt his left wrist.

## 2020-07-01 NOTE — Discharge Instructions (Signed)
Wear your wrist splint for one week.
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
## 2020-10-06 ENCOUNTER — Other Ambulatory Visit: Payer: Self-pay

## 2020-10-06 ENCOUNTER — Emergency Department (HOSPITAL_COMMUNITY): Payer: Self-pay

## 2020-10-06 ENCOUNTER — Emergency Department (HOSPITAL_COMMUNITY)
Admission: EM | Admit: 2020-10-06 | Discharge: 2020-10-06 | Disposition: A | Discharge status: Home / Self Care | Payer: Self-pay | Attending: Emergency Medicine | Admitting: Emergency Medicine

## 2020-10-06 DIAGNOSIS — T50901A Poisoning by unspecified drugs, medicaments and biological substances, accidental (unintentional), initial encounter: Secondary | ICD-10-CM | POA: Insufficient documentation

## 2020-10-06 DIAGNOSIS — F17210 Nicotine dependence, cigarettes, uncomplicated: Secondary | ICD-10-CM | POA: Insufficient documentation
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
## 2020-10-06 NOTE — Discharge Instructions (Signed)
Please return for any problem.  °

## 2021-03-05 ENCOUNTER — Emergency Department (HOSPITAL_COMMUNITY): Payer: Self-pay

## 2021-03-05 ENCOUNTER — Emergency Department (HOSPITAL_COMMUNITY)
Admission: EM | Admit: 2021-03-05 | Discharge: 2021-03-06 | Disposition: A | Discharge status: Home / Self Care | Payer: Self-pay | Attending: Emergency Medicine | Admitting: Emergency Medicine

## 2021-03-05 ENCOUNTER — Encounter (HOSPITAL_COMMUNITY): Payer: Self-pay

## 2021-03-05 ENCOUNTER — Other Ambulatory Visit: Payer: Self-pay

## 2021-03-05 DIAGNOSIS — L089 Local infection of the skin and subcutaneous tissue, unspecified: Secondary | ICD-10-CM | POA: Insufficient documentation

## 2021-03-05 DIAGNOSIS — F17210 Nicotine dependence, cigarettes, uncomplicated: Secondary | ICD-10-CM | POA: Insufficient documentation

## 2021-03-05 DIAGNOSIS — S60511A Abrasion of right hand, initial encounter: Secondary | ICD-10-CM | POA: Insufficient documentation

## 2021-03-05 DIAGNOSIS — Y92410 Unspecified street and highway as the place of occurrence of the external cause: Secondary | ICD-10-CM | POA: Insufficient documentation

## 2021-03-05 DIAGNOSIS — Z23 Encounter for immunization: Secondary | ICD-10-CM | POA: Insufficient documentation
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
## 2021-03-06 MED ORDER — CLINDAMYCIN PHOSPHATE 600 MG/50ML IV SOLN
600.0000 mg | Freq: Once | INTRAVENOUS | Status: AC
  Administered 2021-03-06: 02:00:00 600 mg via INTRAVENOUS
  Filled 2021-03-06: qty 50

## 2021-03-06 MED ORDER — CLINDAMYCIN HCL 300 MG PO CAPS: 300.0000 mg | ORAL_CAPSULE | Freq: Four times a day (QID) | ORAL | 0 refills | Status: AC

## 2021-03-06 MED ORDER — TETANUS-DIPHTH-ACELL PERTUSSIS 5-2.5-18.5 LF-MCG/0.5 IM SUSY
0.5000 mL | PREFILLED_SYRINGE | Freq: Once | INTRAMUSCULAR | Status: AC
  Administered 2021-03-06: 02:00:00 0.5 mL via INTRAMUSCULAR
  Filled 2021-03-06: qty 0.5

## 2021-03-06 MED ORDER — TETANUS-DIPHTH-ACELL PERTUSSIS 5-2.5-18.5 LF-MCG/0.5 IM SUSY
0.5000 mL | PREFILLED_SYRINGE | Freq: Once | INTRAMUSCULAR | Status: AC
  Administered 2021-03-06: 04:00:00 0.5 mL via INTRAMUSCULAR
  Filled 2021-03-06: qty 0.5

## 2021-03-06 NOTE — Discharge Instructions (Addendum)
Wash the hand and change the dressing daily.  Take the antibiotics as discussed.  Return here as needed if you have any worsening symptoms, increased swelling, worsening pain, drainage, fevers or other worsening symptoms.

## 2021-03-06 NOTE — ED Notes (Signed)
ED Provider at bedside. 
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
## 2022-01-01 IMAGING — CR DG WRIST COMPLETE 3+V*R*
4 series · 4 of 4 positions shown · non-contrast
Comparison: None.

CLINICAL DATA: Wrist injury

EXAM:
RIGHT WRIST - COMPLETE 3+ VIEW

[x wrist pa right]
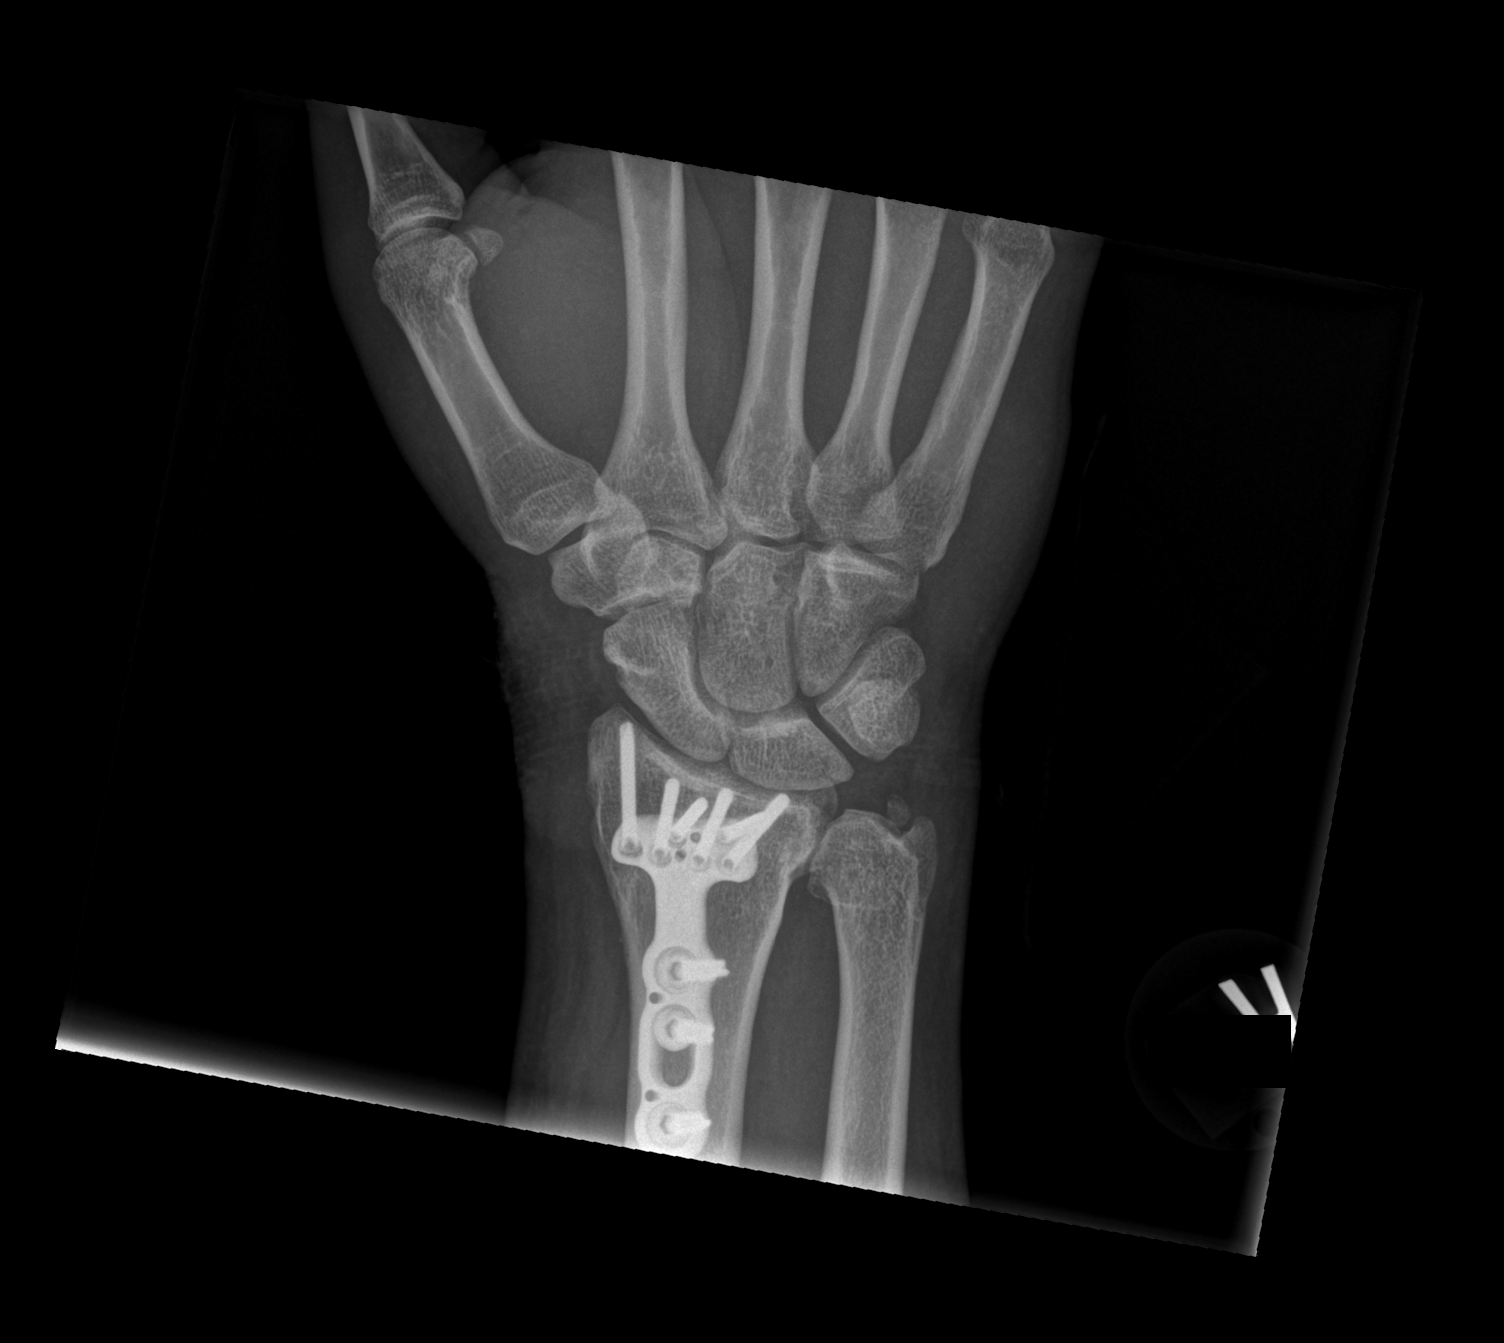

[x wrist obl right]
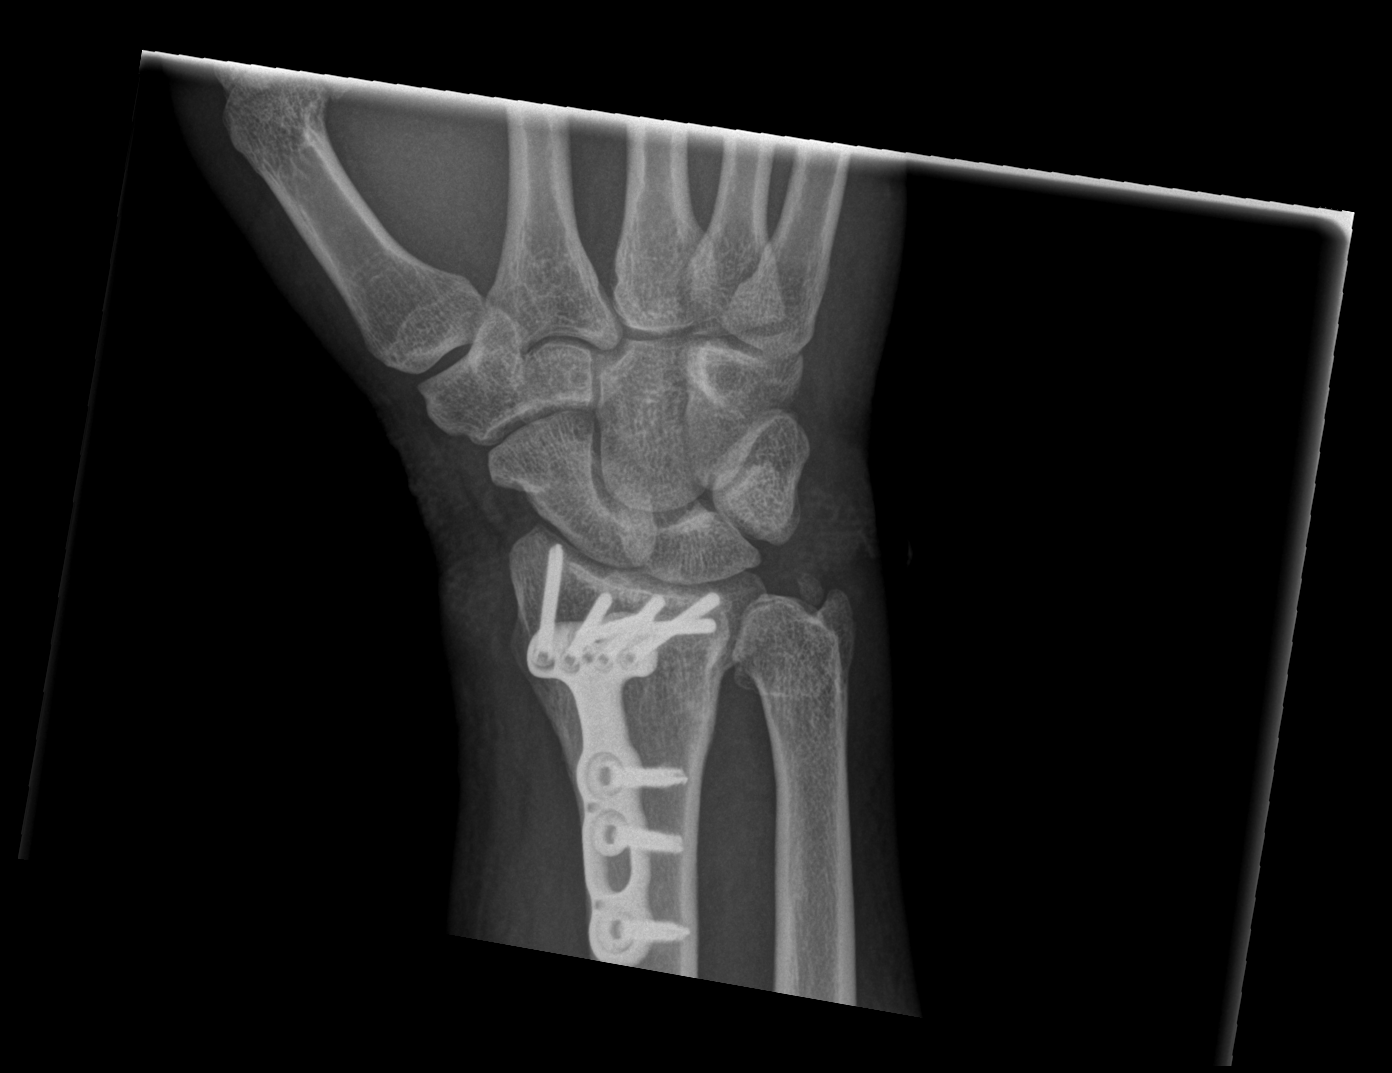

[x wrist lat right]
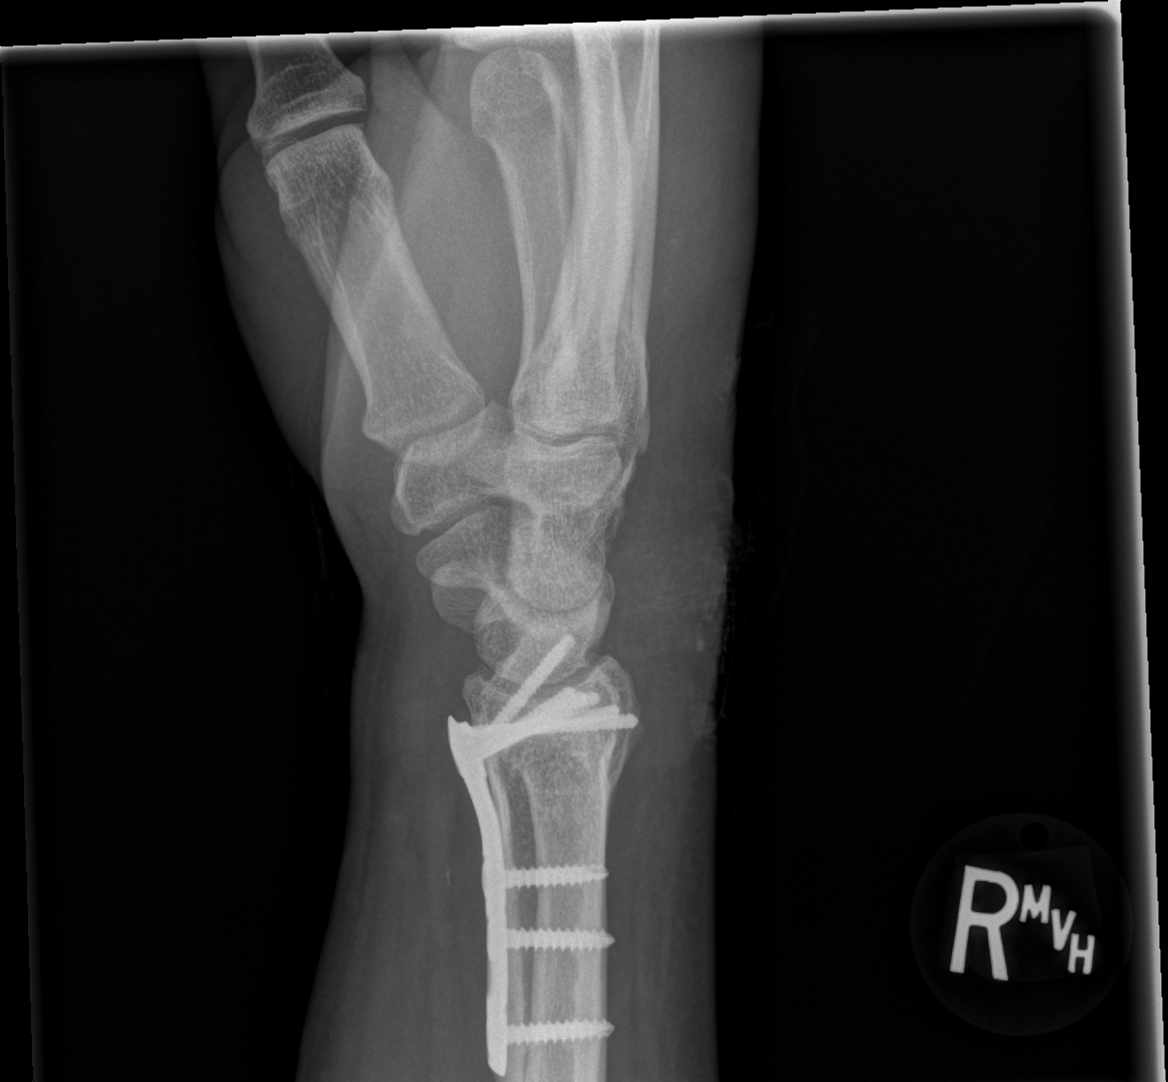

[x wrist navicular view right]
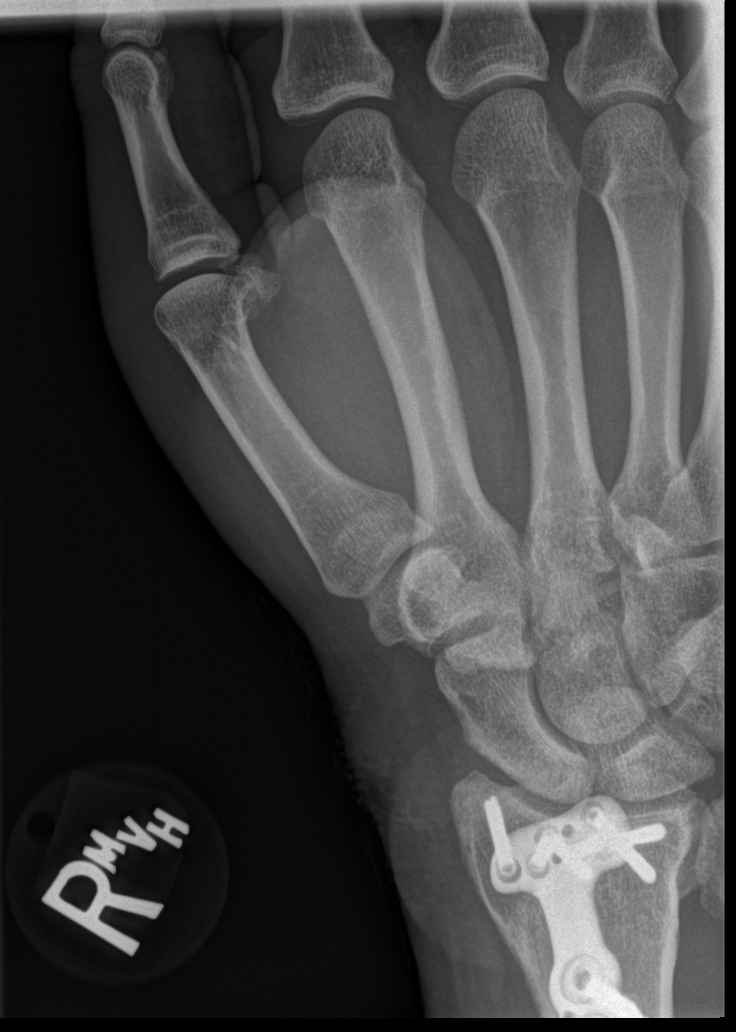

[4 of 4 positions shown; findings below may reference images not displayed]

FINDINGS: Surgical plate and fixating screws in the distal radius. No acute
displaced fracture or malalignment. Ossicle or old ulnar styloid
process fracture. Generalized soft tissue swelling without
radiopaque foreign body. No soft tissue emphysema
IMPRESSION: Postsurgical changes of the distal radius. No acute osseous
abnormality
